# Patient Record
Sex: Female | Born: 1975 | Race: Black or African American | Hispanic: No | Marital: Married | Smoking: Never smoker
Health system: Southern US, Community
[De-identification: ages and names within clinical notes are randomized; demographics above are authoritative.]

## PROBLEM LIST (undated history)

## (undated) DIAGNOSIS — E559 Vitamin D deficiency, unspecified: Principal | ICD-10-CM

## (undated) DIAGNOSIS — R87629 Unspecified abnormal cytological findings in specimens from vagina: Secondary | ICD-10-CM

## (undated) DIAGNOSIS — E785 Hyperlipidemia, unspecified: Secondary | ICD-10-CM

## (undated) DIAGNOSIS — A630 Anogenital (venereal) warts: Secondary | ICD-10-CM

## (undated) HISTORY — DX: Anogenital (venereal) warts: A63.0

## (undated) HISTORY — DX: Hyperlipidemia, unspecified: E78.5

## (undated) HISTORY — DX: Vitamin D deficiency, unspecified: E55.9

## (undated) HISTORY — DX: Unspecified abnormal cytological findings in specimens from vagina: R87.629

---

## 2007-02-27 HISTORY — PX: DILATION AND CURETTAGE OF UTERUS: SHX78

## 2012-02-27 HISTORY — PX: ESSURE TUBAL LIGATION: SUR464

## 2014-11-09 ENCOUNTER — Other Ambulatory Visit (HOSPITAL_COMMUNITY)
Admission: RE | Admit: 2014-11-09 | Discharge: 2014-11-09 | Disposition: A | Discharge status: Home / Self Care | Payer: BLUE CROSS/BLUE SHIELD | Source: Ambulatory Visit | Attending: Adult Health | Admitting: Adult Health

## 2014-11-09 ENCOUNTER — Encounter: Payer: Self-pay | Admitting: Adult Health

## 2014-11-09 ENCOUNTER — Ambulatory Visit (INDEPENDENT_AMBULATORY_CARE_PROVIDER_SITE_OTHER): Payer: BLUE CROSS/BLUE SHIELD | Admitting: Adult Health

## 2014-11-09 VITALS — BP 130/80 | HR 88 | Ht 65.0 in | Wt 231.0 lb

## 2014-11-09 DIAGNOSIS — Z113 Encounter for screening for infections with a predominantly sexual mode of transmission: Secondary | ICD-10-CM | POA: Diagnosis present

## 2014-11-09 DIAGNOSIS — Z1151 Encounter for screening for human papillomavirus (HPV): Secondary | ICD-10-CM | POA: Insufficient documentation

## 2014-11-09 DIAGNOSIS — Z01419 Encounter for gynecological examination (general) (routine) without abnormal findings: Secondary | ICD-10-CM | POA: Diagnosis not present

## 2014-11-09 DIAGNOSIS — Z01411 Encounter for gynecological examination (general) (routine) with abnormal findings: Secondary | ICD-10-CM | POA: Diagnosis present

## 2014-11-09 DIAGNOSIS — A630 Anogenital (venereal) warts: Secondary | ICD-10-CM

## 2014-11-09 MED ORDER — IMIQUIMOD 5 % EX CREA: TOPICAL_CREAM | CUTANEOUS | Status: DC

## 2014-11-09 NOTE — Patient Instructions (Signed)
Physical in 1 year Mammogram at 41  Fasting labs in future

## 2014-11-09 NOTE — Progress Notes (Signed)
Patient ID: Gina Mccullough, female   DOB: [birth year], 39 y.o.   MRN: [id number] History of Present Illness: Gina Mccullough is a 39 year old black female,married in for well woman gyn exam and pap, and complains of warts,wants Rx for aldara.She wants STD testing.She is new to this practice, is from [place].   Current Medications, Allergies, Past Medical History, Past Surgical History, Family History and Social History were reviewed in [employer] record.     Review of Systems: Patient denies any headaches, hearing loss, fatigue, blurred vision, shortness of breath, chest pain, abdominal pain, problems with bowel movements, urination, or intercourse. No joint pain or mood swings.See HPI for positives.    Physical Exam:BP 130/80 mmHg  Pulse 88  Ht  (1.651 m)  Wt 231 lb (104.781 kg)  BMI 38.44 kg/m2  LMP 11/05/2014 General:  Well developed, well nourished, no acute distress Skin:  Warm and dry Neck:  Midline trachea, normal thyroid, good ROM, no lymphadenopathy Lungs; Clear to auscultation bilaterally Breast:  No dominant palpable mass, retraction, or nipple discharge, has tenderness RUQ of right breast Cardiovascular: Regular rate and rhythm Abdomen:  Soft, non tender, no hepatosplenomegaly Pelvic:  External genitalia is normal in appearance, no lesions.  The vagina is normal in appearance, with period like blood. Urethra has no lesions or masses. The cervix is bulbous,pap with HPV and GC/CHL performed, has dark period blood from os.  Uterus is felt to be normal size, shape, and contour.  No adnexal masses or tenderness noted.Bladder is non tender, no masses felt.She says has warts near anal area. Extremities/musculoskeletal:  No swelling or varicosities noted, no clubbing or cyanosis Psych:  No mood changes, alert and cooperative,seems happy   Impression: Well woman gyn exam with Pap STD screening Warts     Plan: HIV,RPR and HSV2 Physical in 1 year Mammogram at  41 Fasting labs in near future Rx aldara 5% cream, #12 with 1 refill use 3 x a week to warts

## 2014-11-10 LAB — HSV 2 ANTIBODY, IGG

## 2014-11-10 LAB — HIV ANTIBODY (ROUTINE TESTING W REFLEX): HIV SCREEN 4TH GENERATION: NONREACTIVE

## 2014-11-10 LAB — RPR: RPR: NONREACTIVE

## 2014-11-11 LAB — CYTOLOGY - PAP

## 2015-11-15 ENCOUNTER — Other Ambulatory Visit (HOSPITAL_COMMUNITY)
Admission: RE | Admit: 2015-11-15 | Discharge: 2015-11-15 | Disposition: A | Discharge status: Home / Self Care | Payer: BLUE CROSS/BLUE SHIELD | Source: Ambulatory Visit | Attending: Adult Health | Admitting: Adult Health

## 2015-11-15 ENCOUNTER — Encounter: Payer: Self-pay | Admitting: Adult Health

## 2015-11-15 ENCOUNTER — Ambulatory Visit (INDEPENDENT_AMBULATORY_CARE_PROVIDER_SITE_OTHER): Payer: BLUE CROSS/BLUE SHIELD | Admitting: Adult Health

## 2015-11-15 VITALS — BP 148/92 | HR 92 | Ht 65.0 in | Wt 230.0 lb

## 2015-11-15 DIAGNOSIS — Z01411 Encounter for gynecological examination (general) (routine) with abnormal findings: Secondary | ICD-10-CM | POA: Diagnosis not present

## 2015-11-15 DIAGNOSIS — Z113 Encounter for screening for infections with a predominantly sexual mode of transmission: Secondary | ICD-10-CM | POA: Insufficient documentation

## 2015-11-15 DIAGNOSIS — Z1151 Encounter for screening for human papillomavirus (HPV): Secondary | ICD-10-CM | POA: Insufficient documentation

## 2015-11-15 DIAGNOSIS — Z01419 Encounter for gynecological examination (general) (routine) without abnormal findings: Secondary | ICD-10-CM

## 2015-11-15 DIAGNOSIS — A630 Anogenital (venereal) warts: Secondary | ICD-10-CM | POA: Diagnosis not present

## 2015-11-15 MED ORDER — IMIQUIMOD 5 % EX CREA: TOPICAL_CREAM | CUTANEOUS | 1 refills | Status: DC

## 2015-11-15 NOTE — Progress Notes (Signed)
Patient ID: Gina Mccullough, female   DOB: 12/16/1975, 40 y.o.   MRN: [id number] History of Present Illness: Gina Mccullough is a 40 year old black female, married in for a well woman gyn exam and pap.Her pap last year showed ASCUS with negative HPV.She is requesting STD testing.She says she does not have PCP at present.She drives for [employer].    Current Medications, Allergies, Past Medical History, Past Surgical History, Family History and Social History were reviewed in [facility] electronic [facility] record.     Review of Systems: Patient denies any headaches, hearing loss, fatigue, blurred vision, shortness of breath, chest pain, abdominal pain, problems with bowel movements, urination, or intercourse. No joint pain or mood swings. She thinks she still has 2 small warts and wants Aldara refilled.She said stomach upset today,thinks it is something she ate.   Physical Exam:BP (!) 148/92 (BP Location: Left Arm, Patient Position: Sitting, Cuff Size: Large)   Pulse 92   Ht 5\' 5"  (1.651 m)   Wt 230 lb (104.3 kg)   LMP 10/15/2015   BMI 38.27 kg/m  General:  Well developed, well nourished, no acute distress Skin:  Warm and dry Neck:  Midline trachea, normal thyroid, good ROM, no lymphadenopathy Lungs; Clear to auscultation bilaterally Breast:  No dominant palpable mass, retraction, or nipple discharge Cardiovascular: Regular rate and rhythm Abdomen:  Soft, non tender, no hepatosplenomegaly Pelvic:  External genitalia is normal in appearance, no lesions.  The vagina is normal in appearance. Urethra has no lesions or masses. The cervix is bulbous. Pap with HPV and GC/CHL performed. Uterus is felt to be normal size, shape, and contour.  No adnexal masses or tenderness noted.Bladder is non tender, no masses felt.I did not see any warts today. Extremities/musculoskeletal:  No swelling or varicosities noted, no clubbing or cyanosis Psych:  No mood changes, alert and cooperative,seems happy   Impression: 1.  Encounter for gynecological examination with Papanicolaou smear of cervix   2. Warts, genital   3. Screening for STD (sexually transmitted disease)       Plan: Check CBC,CMP,TSH and lipids,A1c and vitamin D,HIV,RPR and HSV 2 Rx aldara 5% cream #12 use 3 x weekly as needed with 1 refill Physical in 1 year, pap in 3 if normal

## 2015-11-15 NOTE — Patient Instructions (Signed)
Physical in 1 year, pap in 3 if normal  Will talk when labs back 

## 2015-11-16 ENCOUNTER — Telehealth: Payer: Self-pay | Admitting: Adult Health

## 2015-11-16 ENCOUNTER — Encounter: Payer: Self-pay | Admitting: Adult Health

## 2015-11-16 DIAGNOSIS — E559 Vitamin D deficiency, unspecified: Secondary | ICD-10-CM

## 2015-11-16 DIAGNOSIS — E785 Hyperlipidemia, unspecified: Secondary | ICD-10-CM

## 2015-11-16 HISTORY — DX: Vitamin D deficiency, unspecified: E55.9

## 2015-11-16 HISTORY — DX: Hyperlipidemia, unspecified: E78.5

## 2015-11-16 LAB — COMPREHENSIVE METABOLIC PANEL
ALBUMIN: 3.8 g/dL (ref 3.5–5.5)
ALT: 15 IU/L (ref 0–32)
AST: 16 IU/L (ref 0–40)
Albumin/Globulin Ratio: 1.2 (ref 1.2–2.2)
Alkaline Phosphatase: 96 IU/L (ref 39–117)
BILIRUBIN TOTAL: 0.4 mg/dL (ref 0.0–1.2)
BUN / CREAT RATIO: 24 — AB (ref 9–23)
BUN: 17 mg/dL (ref 6–20)
CALCIUM: 8.7 mg/dL (ref 8.7–10.2)
CHLORIDE: 105 mmol/L (ref 96–106)
CO2: 20 mmol/L (ref 18–29)
CREATININE: 0.7 mg/dL (ref 0.57–1.00)
GFR, EST AFRICAN AMERICAN: 127 mL/min/{1.73_m2} (ref >59)
GFR, EST NON AFRICAN AMERICAN: 110 mL/min/{1.73_m2} (ref >59)
GLUCOSE: 109 mg/dL — AB (ref 65–99)
Globulin, Total: 3.1 g/dL (ref 1.5–4.5)
Potassium: 4.1 mmol/L (ref 3.5–5.2)
Sodium: 139 mmol/L (ref 134–144)
TOTAL PROTEIN: 6.9 g/dL (ref 6.0–8.5)

## 2015-11-16 LAB — CBC
HEMATOCRIT: 38.9 % (ref 34.0–46.6)
HEMOGLOBIN: 12.5 g/dL (ref 11.1–15.9)
MCH: 26.7 pg (ref 26.6–33.0)
MCHC: 32.1 g/dL (ref 31.5–35.7)
MCV: 83 fL (ref 79–97)
Platelets: 330 10*3/uL (ref 150–379)
RBC: 4.69 x10E6/uL (ref 3.77–5.28)
RDW: 14.2 % (ref 12.3–15.4)
WBC: 7.6 10*3/uL (ref 3.4–10.8)

## 2015-11-16 LAB — LIPID PANEL
Chol/HDL Ratio: 5.6 ratio units — ABNORMAL HIGH (ref 0.0–4.4)
Cholesterol, Total: 174 mg/dL (ref 100–199)
HDL: 31 mg/dL — ABNORMAL LOW (ref >39)
LDL CALC: 95 mg/dL (ref 0–99)
Triglycerides: 241 mg/dL — ABNORMAL HIGH (ref 0–149)
VLDL CHOLESTEROL CAL: 48 mg/dL — AB (ref 5–40)

## 2015-11-16 LAB — HEMOGLOBIN A1C
ESTIMATED AVERAGE GLUCOSE: 114 mg/dL
HEMOGLOBIN A1C: 5.6 % (ref 4.8–5.6)

## 2015-11-16 LAB — HSV 2 ANTIBODY, IGG: HSV 2 Glycoprotein G Ab, IgG: <0.91 index (ref 0.00–0.90)

## 2015-11-16 LAB — HIV ANTIBODY (ROUTINE TESTING W REFLEX): HIV Screen 4th Generation wRfx: NONREACTIVE

## 2015-11-16 LAB — RPR: RPR Ser Ql: NONREACTIVE

## 2015-11-16 LAB — VITAMIN D 25 HYDROXY (VIT D DEFICIENCY, FRACTURES): Vit D, 25-Hydroxy: 16 ng/mL — ABNORMAL LOW (ref 30.0–100.0)

## 2015-11-16 LAB — TSH: TSH: 3.17 u[IU]/mL (ref 0.450–4.500)

## 2015-11-16 MED ORDER — CHOLECALCIFEROL 125 MCG (5000 UT) PO CAPS: 5000.0000 [IU] | ORAL_CAPSULE | Freq: Every day | ORAL | Status: DC

## 2015-11-16 NOTE — Telephone Encounter (Signed)
Pt aware of labs and need to decrease carbs, watch fats and increase activity and take vitamin D3 5000 IU daily and recheck labs(lipids,CMP and vitamin D) in 6 months, placed in recall for labs in 6 months

## 2015-11-16 NOTE — Telephone Encounter (Signed)
Left message to call about labs 

## 2015-11-17 LAB — CYTOLOGY - PAP

## 2015-11-18 ENCOUNTER — Telehealth: Payer: Self-pay | Admitting: Adult Health

## 2015-11-18 NOTE — Telephone Encounter (Signed)
Left message that pap showed some atypical cells but was negative for HPV and GC/CHL will repeat pap in 1 year, call with any questions.

## 2016-09-16 ENCOUNTER — Inpatient Hospital Stay (HOSPITAL_COMMUNITY): Payer: BLUE CROSS/BLUE SHIELD

## 2016-09-16 ENCOUNTER — Encounter (HOSPITAL_COMMUNITY): Payer: Self-pay | Admitting: *Deleted

## 2016-09-16 ENCOUNTER — Inpatient Hospital Stay (HOSPITAL_COMMUNITY)
Admission: EM | Admit: 2016-09-16 | Discharge: 2016-09-19 | DRG: 440 | Disposition: A | Discharge status: Home / Self Care | Payer: BLUE CROSS/BLUE SHIELD | Attending: Internal Medicine | Admitting: Internal Medicine

## 2016-09-16 ENCOUNTER — Emergency Department (HOSPITAL_COMMUNITY): Payer: BLUE CROSS/BLUE SHIELD

## 2016-09-16 DIAGNOSIS — R739 Hyperglycemia, unspecified: Secondary | ICD-10-CM | POA: Diagnosis present

## 2016-09-16 DIAGNOSIS — E785 Hyperlipidemia, unspecified: Secondary | ICD-10-CM | POA: Diagnosis present

## 2016-09-16 DIAGNOSIS — Z833 Family history of diabetes mellitus: Secondary | ICD-10-CM

## 2016-09-16 DIAGNOSIS — Z8249 Family history of ischemic heart disease and other diseases of the circulatory system: Secondary | ICD-10-CM

## 2016-09-16 DIAGNOSIS — R1013 Epigastric pain: Secondary | ICD-10-CM | POA: Diagnosis present

## 2016-09-16 DIAGNOSIS — K859 Acute pancreatitis without necrosis or infection, unspecified: Principal | ICD-10-CM | POA: Diagnosis present

## 2016-09-16 DIAGNOSIS — Z79899 Other long term (current) drug therapy: Secondary | ICD-10-CM | POA: Diagnosis not present

## 2016-09-16 DIAGNOSIS — A630 Anogenital (venereal) warts: Secondary | ICD-10-CM | POA: Diagnosis present

## 2016-09-16 DIAGNOSIS — R7302 Impaired glucose tolerance (oral): Secondary | ICD-10-CM

## 2016-09-16 DIAGNOSIS — M4696 Unspecified inflammatory spondylopathy, lumbar region: Secondary | ICD-10-CM | POA: Diagnosis present

## 2016-09-16 DIAGNOSIS — E559 Vitamin D deficiency, unspecified: Secondary | ICD-10-CM | POA: Diagnosis present

## 2016-09-16 DIAGNOSIS — M5416 Radiculopathy, lumbar region: Secondary | ICD-10-CM

## 2016-09-16 LAB — COMPREHENSIVE METABOLIC PANEL
ALK PHOS: 86 U/L (ref 38–126)
ALT: 21 U/L (ref 14–54)
ANION GAP: 6 (ref 5–15)
AST: 22 U/L (ref 15–41)
Albumin: 3.8 g/dL (ref 3.5–5.0)
BUN: 16 mg/dL (ref 6–20)
CALCIUM: 9.5 mg/dL (ref 8.9–10.3)
CO2: 24 mmol/L (ref 22–32)
CREATININE: 0.63 mg/dL (ref 0.44–1.00)
Chloride: 108 mmol/L (ref 101–111)
Glucose, Bld: 116 mg/dL — ABNORMAL HIGH (ref 65–99)
Potassium: 3.8 mmol/L (ref 3.5–5.1)
SODIUM: 138 mmol/L (ref 135–145)
Total Bilirubin: 1 mg/dL (ref 0.3–1.2)
Total Protein: 7.7 g/dL (ref 6.5–8.1)

## 2016-09-16 LAB — URINALYSIS, ROUTINE W REFLEX MICROSCOPIC
BILIRUBIN URINE: NEGATIVE
Bacteria, UA: NONE SEEN
GLUCOSE, UA: NEGATIVE mg/dL
KETONES UR: NEGATIVE mg/dL
NITRITE: NEGATIVE
PH: 6 (ref 5.0–8.0)
PROTEIN: 30 mg/dL — AB
Specific Gravity, Urine: 1.028 (ref 1.005–1.030)

## 2016-09-16 LAB — LIPASE, BLOOD: LIPASE: 2003 U/L — AB (ref 11–51)

## 2016-09-16 LAB — LIPID PANEL
CHOL/HDL RATIO: 4.3 ratio
CHOLESTEROL: 147 mg/dL (ref 0–200)
HDL: 34 mg/dL — ABNORMAL LOW (ref >40)
LDL CALC: 100 mg/dL — AB (ref 0–99)
Triglycerides: 63 mg/dL (ref <150)
VLDL: 13 mg/dL (ref 0–40)

## 2016-09-16 LAB — CBC
HCT: 36.5 % (ref 36.0–46.0)
HEMOGLOBIN: 12.1 g/dL (ref 12.0–15.0)
MCH: 27.4 pg (ref 26.0–34.0)
MCHC: 33.2 g/dL (ref 30.0–36.0)
MCV: 82.8 fL (ref 78.0–100.0)
PLATELETS: 306 10*3/uL (ref 150–400)
RBC: 4.41 MIL/uL (ref 3.87–5.11)
RDW: 13.3 % (ref 11.5–15.5)
WBC: 8.4 10*3/uL (ref 4.0–10.5)

## 2016-09-16 LAB — I-STAT BETA HCG BLOOD, ED (MC, WL, AP ONLY): I-stat hCG, quantitative: <5 m[IU]/mL (ref <5)

## 2016-09-16 MED ORDER — ACETAMINOPHEN 650 MG RE SUPP: 650.0000 mg | Freq: Four times a day (QID) | RECTAL | Status: DC | PRN

## 2016-09-16 MED ORDER — ONDANSETRON HCL 4 MG PO TABS: 4.0000 mg | ORAL_TABLET | Freq: Four times a day (QID) | ORAL | Status: DC | PRN

## 2016-09-16 MED ORDER — ORAL CARE MOUTH RINSE
15.0000 mL | Freq: Two times a day (BID) | OROMUCOSAL | Status: DC
  Administered 2016-09-16 – 2016-09-19 (×7): 15 mL via OROMUCOSAL

## 2016-09-16 MED ORDER — DICYCLOMINE HCL 10 MG/ML IM SOLN
20.0000 mg | Freq: Once | INTRAMUSCULAR | Status: AC
  Administered 2016-09-16: 20 mg via INTRAMUSCULAR
  Filled 2016-09-16: qty 2

## 2016-09-16 MED ORDER — MORPHINE SULFATE (PF) 2 MG/ML IV SOLN
INTRAVENOUS | Status: AC
  Administered 2016-09-16: 4 mg via INTRAVENOUS
  Filled 2016-09-16: qty 2

## 2016-09-16 MED ORDER — ENOXAPARIN SODIUM 40 MG/0.4ML ~~LOC~~ SOLN
40.0000 mg | SUBCUTANEOUS | Status: DC
  Administered 2016-09-16 – 2016-09-19 (×4): 40 mg via SUBCUTANEOUS
  Filled 2016-09-16 (×4): qty 0.4

## 2016-09-16 MED ORDER — FENTANYL CITRATE (PF) 100 MCG/2ML IJ SOLN
50.0000 ug | Freq: Once | INTRAMUSCULAR | Status: AC
  Administered 2016-09-16: 50 ug via INTRAVENOUS
  Filled 2016-09-16: qty 2

## 2016-09-16 MED ORDER — ACETAMINOPHEN 325 MG PO TABS
650.0000 mg | ORAL_TABLET | Freq: Four times a day (QID) | ORAL | Status: DC | PRN
  Administered 2016-09-19: 650 mg via ORAL
  Filled 2016-09-16 (×2): qty 2

## 2016-09-16 MED ORDER — ONDANSETRON HCL 4 MG/2ML IJ SOLN
4.0000 mg | Freq: Four times a day (QID) | INTRAMUSCULAR | Status: DC | PRN
  Administered 2016-09-16 – 2016-09-17 (×4): 4 mg via INTRAVENOUS
  Filled 2016-09-16 (×4): qty 2

## 2016-09-16 MED ORDER — SODIUM CHLORIDE 0.9 % IV BOLUS (SEPSIS)
1000.0000 mL | Freq: Once | INTRAVENOUS | Status: AC
  Administered 2016-09-16: 1000 mL via INTRAVENOUS

## 2016-09-16 MED ORDER — SODIUM CHLORIDE 0.9 % IV BOLUS (SEPSIS)
500.0000 mL | Freq: Once | INTRAVENOUS | Status: AC
  Administered 2016-09-16: 500 mL via INTRAVENOUS

## 2016-09-16 MED ORDER — POTASSIUM CHLORIDE IN NACL 20-0.9 MEQ/L-% IV SOLN
INTRAVENOUS | Status: DC
  Administered 2016-09-16 – 2016-09-19 (×9): via INTRAVENOUS
  Filled 2016-09-16 (×3): qty 1000

## 2016-09-16 MED ORDER — MORPHINE SULFATE (PF) 4 MG/ML IV SOLN
4.0000 mg | INTRAVENOUS | Status: DC | PRN
  Administered 2016-09-16 – 2016-09-18 (×6): 4 mg via INTRAVENOUS
  Filled 2016-09-16 (×6): qty 1

## 2016-09-16 MED ORDER — IOPAMIDOL (ISOVUE-300) INJECTION 61%
100.0000 mL | Freq: Once | INTRAVENOUS | Status: AC | PRN
  Administered 2016-09-16: 100 mL via INTRAVENOUS

## 2016-09-16 MED ORDER — MORPHINE SULFATE (PF) 4 MG/ML IV SOLN
4.0000 mg | INTRAVENOUS | Status: DC | PRN
  Administered 2016-09-16: 4 mg via INTRAVENOUS
  Filled 2016-09-16: qty 1

## 2016-09-16 NOTE — ED Triage Notes (Signed)
Pt c/o abd pain that radiates around upper abd area to back,

## 2016-09-16 NOTE — H&P (Signed)
History and Physical  Gina Bruckneradia Kimberlin RUE:[id number] DOB: 1975/06/30 DOA: 09/16/2016   PCP: Patient, No Pcp Per   Patient coming from: Home  Chief Complaint: Home  HPI:  Gina Mccullough is a 41 y.o. female with dyslipidemia presents with one-day history of epigastric abdominal pain that began on the evening of 09/15/2016.  The patient had some nausea without emesis or diarrhea. She has not had any fevers, chills, dysuria, hematuria, hematochezia, melena. She denies any exacerbating or alleviating factors. She denies any recent travels or eating any unusual foods. She has not started any new medications, nor has she started taking any new supplements over-the-counter. She denies any alcohol or illegal drug use. In the emergency department, the patient was afebrile hemodynamically stable saturating 99% on room air. BMP and CBC were unremarkable. Hepatic enzymes were unremarkable. Lipase was 2003. Urinalysis was negative for pyuria. The patient was in May for acute pancreatitis.  Assessment/Plan: Acute pancreatitis -Etiology unclear -remain npo -IVF -RUQ ultrasound -CT abd/pelvis -lipid panel -HIV -ANA -pain control  Hyperglycemia -Hemoglobin A1c       Past Medical History:  Diagnosis Date  . Dyslipidemia 11/16/2015  . Vaginal Pap smear, abnormal    hx HPV  . Vitamin D deficiency 11/16/2015  . Warts, genital    Past Surgical History:  Procedure Laterality Date  . CESAREAN SECTION  [id number]  . DILATION AND CURETTAGE OF UTERUS  2009  . ESSURE TUBAL LIGATION  2014   Social History:  reports that she has never smoked. She has never used smokeless tobacco. She reports that she does not drink alcohol or use drugs.   Family History  Problem Relation Age of Onset  . Heart disease Mother   . Stroke Mother   . Diabetes Brother      No Known Allergies   Prior to Admission medications   Medication Sig Start Date End Date Taking? Authorizing Provider  Cholecalciferol  5000 units capsule Take 1 capsule (5,000 Units total) by mouth daily. 11/16/15  Yes Adline PotterGriffin, Jennifer A, NP  vitamin B-12 (CYANOCOBALAMIN) 100 MCG tablet Take 100 mcg by mouth daily.   Yes [provider]  imiquimod (ALDARA) 5 % cream Apply topically 3 (three) times a week. 11/16/15   Adline PotterGriffin, Jennifer A, NP    Review of Systems:  Constitutional:  No weight loss, night sweats, Fevers, chills, fatigue.  Head&Eyes: No headache.  No vision loss.  No eye pain or scotoma ENT:  No Difficulty swallowing,Tooth/dental problems,Sore throat,  No ear ache, post nasal drip,  Cardio-vascular:  No chest pain, Orthopnea, PND, swelling in lower extremities,  dizziness, palpitations  GI:  No  vomiting, diarrhea, loss of appetite, hematochezia, melena, heartburn, indigestion, Resp:  No shortness of breath with exertion or at rest. No cough. No coughing up of blood .No wheezing.No chest wall deformity  Skin:  no rash or lesions.  GU:  no dysuria, change in color of urine, no urgency or frequency. No flank pain.  Musculoskeletal:  No joint pain or swelling. No decreased range of motion. No back pain.  Psych:  No change in mood or affect. No depression or anxiety. Neurologic: No headache, no dysesthesia, no focal weakness, no vision loss. No syncope  Physical Exam: Vitals:   09/16/16 0543 09/16/16 0728 09/16/16 0730 09/16/16 0745  BP: (!) 161/90 (!) 141/82 138/88 138/88  Pulse: 83 81 73 80  Resp: 16   15  Temp: 98.3 F (36.8 C)     TempSrc:  Oral     SpO2: 95% 99% 97% 98%  Weight:      Height:       General:  A&O x 3, NAD, nontoxic, pleasant/cooperative Head/Eye: No conjunctival hemorrhage, no icterus, [place]/AT, No nystagmus ENT:  No icterus,  No thrush, good dentition, no pharyngeal exudate Neck:  No masses, no lymphadenpathy, no bruits CV:  RRR, no rub, no gallop, no S3 Lung:  CTAB, good air movement, no wheeze, no rhonchi Abdomen: soft/NT, +BS, nondistended, no peritoneal signs Ext:  No cyanosis, No rashes, No petechiae, No lymphangitis, No edema Neuro: CNII-XII intact, strength 4/5 in bilateral upper and lower extremities, no dysmetria  Labs on Admission:  Basic Metabolic Panel:  Recent Labs Lab 09/16/16 0554  NA 138  K 3.8  CL 108  CO2 24  GLUCOSE 116*  BUN 16  CREATININE 0.63  CALCIUM 9.5   Liver Function Tests:  Recent Labs Lab 09/16/16 0554  AST 22  ALT 21  ALKPHOS 86  BILITOT 1.0  PROT 7.7  ALBUMIN 3.8    Recent Labs Lab 09/16/16 0554  LIPASE 2,003*   No results for input(s): AMMONIA in the last 168 hours. CBC:  Recent Labs Lab 09/16/16 0554  WBC 8.4  HGB 12.1  HCT 36.5  MCV 82.8  PLT 306   Coagulation Profile: No results for input(s): INR, PROTIME in the last 168 hours. Cardiac Enzymes: No results for input(s): CKTOTAL, CKMB, CKMBINDEX, TROPONINI in the last 168 hours. BNP: Invalid input(s): POCBNP CBG: No results for input(s): GLUCAP in the last 168 hours. Urine analysis:    Component Value Date/Time   COLORURINE YELLOW 09/16/2016 0554   APPEARANCEUR HAZY (A) 09/16/2016 0554   LABSPEC 1.028 09/16/2016 0554   PHURINE 6.0 09/16/2016 0554   GLUCOSEU NEGATIVE 09/16/2016 0554   HGBUR LARGE (A) 09/16/2016 0554   BILIRUBINUR NEGATIVE 09/16/2016 0554   KETONESUR NEGATIVE 09/16/2016 0554   PROTEINUR 30 (A) 09/16/2016 0554   NITRITE NEGATIVE 09/16/2016 0554   LEUKOCYTESUR TRACE (A) 09/16/2016 0554   Sepsis Labs: @LABRCNTIP (procalcitonin:4,lacticidven:4) )No results found for this or any previous visit (from the past 240 hour(s)).   Radiological Exams on Admission: No results found.      Time spent:60 minutes Code Status:   FULL Family Communication:  Son updated at bedside Disposition Plan: expect 2-3 day hospitalization Consults called: none DVT Prophylaxis: [place] Lovenox  Golden Gilreath, DO  [facility] Pager [phone]  If 7PM-7AM, please contact night-coverage [email or web address] Password  [email or web address] 09/16/2016, 8:14 AM

## 2016-09-16 NOTE — ED Notes (Signed)
Pt taken to US

## 2016-09-16 NOTE — ED Notes (Signed)
Dr Tat wanted both US and CT. CT tech aware

## 2016-09-16 NOTE — ED Notes (Signed)
Pt requesting pain med. Pt up to bathroom without difficulty

## 2016-09-16 NOTE — ED Provider Notes (Signed)
[facility]-EMERGENCY DEPT Provider Note   CSN: [id number] Arrival date & time: 09/16/16  0533  Time seen 06:05 AM   History   Chief Complaint Chief Complaint  Patient presents with  . Abdominal Pain    HPI Gina Mccullough is a 41 y.o. female.  HPI  patient reports she started getting epigastric abdominal pain about 7:30 PM last night. She denies anything earlier in the day however she did eat lasagna about 10 PM. She states the pain is sharp and constant but waxes and wanes. She states it radiates underneath her rib cage into her upper back. She states certain movements make the pain worse, nothing makes it feel better. She denies any change in her activity or eating anything unusual. She denies nausea, vomiting, or diarrhea. She denies burning in her throat or reflux symptoms. She states she's never had this pain before. She denies abdominal bloating. She took 2 Tylenol about 45 minutes prior to arrival without relief. She states she's had C-section 2 otherwise no prior surgery. Family history is negative for gallstones.  PCP none  Past Medical History:  Diagnosis Date  . Dyslipidemia 11/16/2015  . Vaginal Pap smear, abnormal    hx HPV  . Vitamin D deficiency 11/16/2015  . Warts, genital     Patient Active Problem List   Diagnosis Date Noted  . Vitamin D deficiency 11/16/2015  . Dyslipidemia 11/16/2015  . Warts, genital 11/09/2014    Past Surgical History:  Procedure Laterality Date  . CESAREAN SECTION  [id number]  . DILATION AND CURETTAGE OF UTERUS  2009  . ESSURE TUBAL LIGATION  2014    OB History    Gravida Para Term Preterm AB Living   6 2     4 2    SAB TAB Ectopic Multiple Live Births   2 2             Home Medications    Prior to Admission medications   Medication Sig Start Date End Date Taking? Authorizing Provider  Cholecalciferol 5000 units capsule Take 1 capsule (5,000 Units total) by mouth daily. 11/16/15  Yes Adline Potter, NP  vitamin B-12  (CYANOCOBALAMIN) 100 MCG tablet Take 100 mcg by mouth daily.   Yes [provider]  imiquimod (ALDARA) 5 % cream Apply topically 3 (three) times a week. 11/16/15   Adline Potter, NP    Family History Family History  Problem Relation Age of Onset  . Heart disease Mother   . Stroke Mother   . Diabetes Brother     Social History Social History  Substance Use Topics  . Smoking status: Never Smoker  . Smokeless tobacco: Never Used  . Alcohol use No  employed   Allergies   Patient has no known allergies.   Review of Systems Review of Systems  All other systems reviewed and are negative.    Physical Exam Updated Vital Signs BP (!) 161/90 (BP Location: Left Arm)   Pulse 83   Temp 98.3 F (36.8 C) (Oral)   Resp 16   Ht 5\' 3"  (1.6 m)   Wt 99.8 kg (220 lb)   LMP 09/12/2016   SpO2 95%   BMI 38.97 kg/m   Vital signs normal except for hypertension   Physical Exam  Constitutional: She is oriented to person, place, and time. She appears well-developed and well-nourished.  Non-toxic appearance. She does not appear ill. No distress.  HENT:  Head: Normocephalic and atraumatic.  Right Ear: External ear normal.  Left Ear: External ear normal.  Nose: Nose normal. No mucosal edema or rhinorrhea.  Mouth/Throat: Oropharynx is clear and moist and mucous membranes are normal. No dental abscesses or uvula swelling.  Eyes: Pupils are equal, round, and reactive to light. Conjunctivae and EOM are normal.  Neck: Normal range of motion and full passive range of motion without pain. Neck supple.  Cardiovascular: Normal rate, regular rhythm and normal heart sounds.  Exam reveals no gallop and no friction rub.   No murmur heard. Pulmonary/Chest: Effort normal and breath sounds normal. No respiratory distress. She has no wheezes. She has no rhonchi. She has no rales. She exhibits no tenderness and no crepitus.  Abdominal: Soft. Normal appearance and bowel sounds are normal. She  exhibits no distension. There is tenderness in the epigastric area. There is no rebound and no guarding.    Genitourinary:  Genitourinary Comments: No CVAT bilaterally  Musculoskeletal: Normal range of motion. She exhibits no edema or tenderness.  Moves all extremities well.   Neurological: She is alert and oriented to person, place, and time. She has normal strength. No cranial nerve deficit.  Skin: Skin is warm, dry and intact. No rash noted. No erythema. No pallor.  Psychiatric: She has a normal mood and affect. Her speech is normal and behavior is normal. Her mood appears not anxious.  Nursing note and vitals reviewed.    ED Treatments / Results  Labs (all labs ordered are listed, but only abnormal results are displayed) Results for orders placed or performed during the hospital encounter of 09/16/16  CBC  Result Value Ref Range   WBC 8.4 4.0 - 10.5 K/uL   RBC 4.41 3.87 - 5.11 MIL/uL   Hemoglobin 12.1 12.0 - 15.0 g/dL   HCT 40.9 81.1 - 91.4 %   MCV 82.8 78.0 - 100.0 fL   MCH 27.4 26.0 - 34.0 pg   MCHC 33.2 30.0 - 36.0 g/dL   RDW 78.2 95.6 - 21.3 %   Platelets 306 150 - 400 K/uL  Urinalysis, Routine w reflex microscopic  Result Value Ref Range   Color, Urine YELLOW YELLOW   APPearance HAZY (A) CLEAR   Specific Gravity, Urine 1.028 1.005 - 1.030   pH 6.0 5.0 - 8.0   Glucose, UA NEGATIVE NEGATIVE mg/dL   Hgb urine dipstick LARGE (A) NEGATIVE   Bilirubin Urine NEGATIVE NEGATIVE   Ketones, ur NEGATIVE NEGATIVE mg/dL   Protein, ur 30 (A) NEGATIVE mg/dL   Nitrite NEGATIVE NEGATIVE   Leukocytes, UA TRACE (A) NEGATIVE   RBC / HPF TOO NUMEROUS TO COUNT 0 - 5 RBC/hpf   WBC, UA 0-5 0 - 5 WBC/hpf   Bacteria, UA NONE SEEN NONE SEEN   Squamous Epithelial / LPF 0-5 (A) NONE SEEN   Mucous PRESENT   I-Stat beta hCG blood, ED  Result Value Ref Range   I-stat hCG, quantitative <5.0 <5 mIU/mL   Comment 3           Laboratory interpretation all normal except  Hematuria (pt is on  menses)    EKG  EKG Interpretation None       Radiology No results found.  Procedures Procedures (including critical care time)  Medications Ordered in ED Medications  sodium chloride 0.9 % bolus 1,000 mL (1,000 mLs Intravenous New Bag/Given 09/16/16 0649)  sodium chloride 0.9 % bolus 500 mL (500 mLs Intravenous New Bag/Given 09/16/16 0649)  dicyclomine (BENTYL) injection 20 mg (20 mg Intramuscular Given 09/16/16 0650)  Initial Impression / Assessment and Plan / ED Course  I have reviewed the triage vital signs and the nursing notes.  Pertinent labs & imaging results that were available during my care of the patient were reviewed by me and considered in my medical decision making (see chart for details).  Patient was given IV fluids and IM Bentyl for her pain. Her pain does not appear to be GERD, she also does not have pain in the right upper quadrant to suggest gallstones. She may have pancreatitis. Laboratory testing was ordered.  Recheck at 07:30 AM pt states her pain is better, not gone, discussed her lab tests, still waiting for her lipase to return.   07:50 AM discussed her very elevated lipase with patient. She denies alcohol use, she denies having any pain in her right upper quadrant before this. We discussed the most likely cause would be alcohol or gallstones. CT scan the abdomen and right upper quadrant ultrasound were ordered to look for the etiology for pancreatitis. She states her pain was starting to get worse, she was given IV pain medication. We discussed that she would need to be admitted to have bowel rest until her pancreas is much less inflamed and she will be able to eat again. Patient is agreeable.  08:00 AM Dr Tat, hospitalist, will admit.   Final Clinical Impressions(s) / ED Diagnoses   Final diagnoses:  Epigastric abdominal pain    Plan admission  Devoria AlbeIva Tarry Fountain, MD, [facility]    Rance Smithson, MD 09/16/16 [phone]

## 2016-09-17 ENCOUNTER — Inpatient Hospital Stay (HOSPITAL_COMMUNITY): Payer: BLUE CROSS/BLUE SHIELD

## 2016-09-17 LAB — COMPREHENSIVE METABOLIC PANEL
ALK PHOS: 79 U/L (ref 38–126)
ALT: 17 U/L (ref 14–54)
AST: 21 U/L (ref 15–41)
Albumin: 3.3 g/dL — ABNORMAL LOW (ref 3.5–5.0)
Anion gap: 7 (ref 5–15)
BILIRUBIN TOTAL: 0.8 mg/dL (ref 0.3–1.2)
BUN: 8 mg/dL (ref 6–20)
CALCIUM: 8.6 mg/dL — AB (ref 8.9–10.3)
CO2: 23 mmol/L (ref 22–32)
CREATININE: 0.64 mg/dL (ref 0.44–1.00)
Chloride: 105 mmol/L (ref 101–111)
GFR calc Af Amer: >60 mL/min (ref >60)
Glucose, Bld: 142 mg/dL — ABNORMAL HIGH (ref 65–99)
POTASSIUM: 3.6 mmol/L (ref 3.5–5.1)
Sodium: 135 mmol/L (ref 135–145)
TOTAL PROTEIN: 7.1 g/dL (ref 6.5–8.1)

## 2016-09-17 LAB — HIV ANTIBODY (ROUTINE TESTING W REFLEX): HIV SCREEN 4TH GENERATION: NONREACTIVE

## 2016-09-17 LAB — CBC
HEMATOCRIT: 35.9 % — AB (ref 36.0–46.0)
Hemoglobin: 11.9 g/dL — ABNORMAL LOW (ref 12.0–15.0)
MCH: 27.5 pg (ref 26.0–34.0)
MCHC: 33.1 g/dL (ref 30.0–36.0)
MCV: 83.1 fL (ref 78.0–100.0)
PLATELETS: 293 10*3/uL (ref 150–400)
RBC: 4.32 MIL/uL (ref 3.87–5.11)
RDW: 13.3 % (ref 11.5–15.5)
WBC: 6.2 10*3/uL (ref 4.0–10.5)

## 2016-09-17 LAB — HEMOGLOBIN A1C
HEMOGLOBIN A1C: 5.7 % — AB (ref 4.8–5.6)
Mean Plasma Glucose: 117 mg/dL

## 2016-09-17 LAB — LIPASE, BLOOD: LIPASE: 69 U/L — AB (ref 11–51)

## 2016-09-17 NOTE — Progress Notes (Signed)
PROGRESS NOTE  Gina Mccullough ZOX:[id number] DOB: 15-Nov-1975 DOA: 09/16/2016 PCP: Patient, No Pcp Per  Brief History:  41 y.o. female with dyslipidemia presents with one-day history of epigastric abdominal pain that began on the evening of 09/15/2016.  The patient had some nausea without emesis or diarrhea. She has not had any fevers, chills, dysuria, hematuria, hematochezia, melena. She denies any exacerbating or alleviating factors. She denies any recent travels or eating any unusual foods. She has not started any new medications, nor has she started taking any new supplements over-the-counter. She denies any alcohol or illegal drug use. In the emergency department, the patient was afebrile and hemodynamically stable. Lipase was 2003 with normal hepatic enzymes. The patient was admitted for treatment of pancreatitis.  Assessment/Plan: Acute pancreatitis -Etiology unclear -start on clear liquids -continue IVF -RUQ ultrasound--neg cholelithiasis or biliary ductal dilatation  -CT abd/pelvis--minimal peripancreatic stranding without any other acute findings  -lipid panel--LDL 100, triglycerides 63  -HIV--pending  -ANA--pending  -pain control--increased morphine to 4 mg IV every 2 hours  Prn pain  Hyperglycemia -Hemoglobin A1c--pendind  Back pain -MRI Lumbar spine--mild facet arthropathy   Disposition Plan:   Home in 1-2 days  Family Communication:   Family at bedside  Consultants:  none  Code Status:  FULL   DVT Prophylaxis:  [place] Lovenox   Procedures: As Listed in Progress Note Above  Antibiotics: None    Subjective:  she states that her epigastric pain is 25-50% better. She denies any vomiting but has some nausea. She denies any fevers, chills, chest pain, shortness breath, coughing, hemoptysis, vomiting, diarrhea, dysuria, hematuria. She complains of low back pain without any radicular type symptoms. She states the low back pain is intermittent and sharp in  nature without radiation. There is some mild worsening with movement.   Objective: Vitals:   09/16/16 1459 09/16/16 1827 09/16/16 2201 09/17/16 0545  BP: (!) 143/86  127/79 (!) 145/83  Pulse: 79  (!) 59 64  Resp: 20  20 18   Temp: 98 F (36.7 C)  97.6 F (36.4 C) 97.9 F (36.6 C)  TempSrc: Oral  Oral Oral  SpO2: 97% 95% 98% 98%  Weight: 102.3 kg (225 lb 8 oz)     Height: 5\' 3"  (1.6 m)       Intake/Output Summary (Last 24 hours) at 09/17/16 8119 Last data filed at 09/16/16 2142  Gross per 24 hour  Intake           735.42 ml  Output              600 ml  Net           135.42 ml   Weight change: 2.495 kg (5 lb 8 oz) Exam:   General:  Pt is alert, follows commands appropriately, not in acute distress  HEENT: No icterus, No thrush, No neck mass, [place]/AT  Cardiovascular: RRR, S1/S2, no rubs, no gallops  Respiratory: CTA bilaterally, no wheezing, no crackles, no rhonchi  Abdomen: Soft/+BS, epigastric tender, non distended, no guarding  Extremities: No edema, No lymphangitis, No petechiae, No rashes, no synovitis   Data Reviewed: I have personally reviewed following labs and imaging studies Basic Metabolic Panel:  Recent Labs Lab 09/16/16 0554  NA 138  K 3.8  CL 108  CO2 24  GLUCOSE 116*  BUN 16  CREATININE 0.63  CALCIUM 9.5   Liver Function Tests:  Recent Labs Lab 09/16/16 0554  AST 22  ALT 21  ALKPHOS 86  BILITOT 1.0  PROT 7.7  ALBUMIN 3.8    Recent Labs Lab 09/16/16 0554  LIPASE 2,003*   No results for input(s): AMMONIA in the last 168 hours. Coagulation Profile: No results for input(s): INR, PROTIME in the last 168 hours. CBC:  Recent Labs Lab 09/16/16 0554  WBC 8.4  HGB 12.1  HCT 36.5  MCV 82.8  PLT 306   Cardiac Enzymes: No results for input(s): CKTOTAL, CKMB, CKMBINDEX, TROPONINI in the last 168 hours. BNP: Invalid input(s): POCBNP CBG: No results for input(s): GLUCAP in the last 168 hours. HbA1C: No results for input(s):  HGBA1C in the last 72 hours. Urine analysis:    Component Value Date/Time   COLORURINE YELLOW 09/16/2016 0554   APPEARANCEUR HAZY (A) 09/16/2016 0554   LABSPEC 1.028 09/16/2016 0554   PHURINE 6.0 09/16/2016 0554   GLUCOSEU NEGATIVE 09/16/2016 0554   HGBUR LARGE (A) 09/16/2016 0554   BILIRUBINUR NEGATIVE 09/16/2016 0554   KETONESUR NEGATIVE 09/16/2016 0554   PROTEINUR 30 (A) 09/16/2016 0554   NITRITE NEGATIVE 09/16/2016 0554   LEUKOCYTESUR TRACE (A) 09/16/2016 0554   Sepsis Labs: @LABRCNTIP (procalcitonin:4,lacticidven:4) )No results found for this or any previous visit (from the past 240 hour(s)).   Scheduled Meds: . enoxaparin (LOVENOX) injection  40 mg Subcutaneous Q24H  . mouth rinse  15 mL Mouth Rinse BID   Continuous Infusions: . 0.9 % NaCl with KCl 20 mEq / L 125 mL/hr at 09/17/16 0214    Procedures/Studies: Mr Lumbar Spine Wo Contrast  Result Date: 09/17/2016 CLINICAL DATA:  Abdominal pain radiating to the upper back. No known injury. EXAM: MRI LUMBAR SPINE WITHOUT CONTRAST TECHNIQUE: Multiplanar, multisequence MR imaging of the lumbar spine was performed. No intravenous contrast was administered. COMPARISON:  None. FINDINGS: Segmentation:  Standard. Alignment:  Physiologic. Vertebrae:  No fracture, evidence of discitis, or bone lesion. Conus medullaris: Extends to the T12 level and appears normal. Paraspinal and other soft tissues: Negative. Disc levels: Disc spaces: Disc spaces are preserved. T12-L1: No significant disc bulge. No evidence of neural foraminal stenosis. No central canal stenosis. L1-L2: No significant disc bulge. No evidence of neural foraminal stenosis. No central canal stenosis. L2-L3: No significant disc bulge. No evidence of neural foraminal stenosis. No central canal stenosis. L3-L4: No significant disc bulge. Mild bilateral facet arthropathy. No evidence of neural foraminal stenosis. No central canal stenosis. L4-L5: No significant disc bulge. No evidence  of neural foraminal stenosis. No central canal stenosis. Mild bilateral facet arthropathy. L5-S1: No significant disc bulge. No evidence of neural foraminal stenosis. No central canal stenosis. IMPRESSION: 1. Mild bilateral facet arthropathy L3-4 and L4-5. 2. No significant disc protrusion, foraminal stenosis or central canal stenosis. Electronically Signed   By: Elige Ko   On: 09/17/2016 08:34   Ct Abdomen Pelvis W Contrast  Result Date: 09/16/2016 CLINICAL DATA:  Right abdominal pain, acute pancreatitis EXAM: CT ABDOMEN AND PELVIS WITH CONTRAST TECHNIQUE: Multidetector CT imaging of the abdomen and pelvis was performed using the standard protocol following bolus administration of intravenous contrast. CONTRAST:  ISOVUE-300 IOPAMIDOL (ISOVUE-300) INJECTION 61% COMPARISON:  None. FINDINGS: Lower chest: Lung bases are clear. Hepatobiliary: Liver is within normal limits. Gallbladder is unremarkable. No intrahepatic extrahepatic duct dilatation. Pancreas: Pancreas is essentially within normal limits on CT. Very minimal peripancreatic inflammatory changes (for example, series 2/ image 19). No pancreatic mass or ductal dilatation. No peripancreatic fluid collection/pseudocyst. Spleen: Within normal limits. Adrenals/Urinary Tract: Adrenal glands are within normal limits.  Kidneys are within normal limits.  No hydronephrosis. Bladder is underdistended but unremarkable. Stomach/Bowel: Stomach is within normal limits. No evidence of bowel obstruction. Normal appendix (series 2/ image 45). Left colon is decompressed. Vascular/Lymphatic: No evidence of abdominal aortic aneurysm. No suspicious abdominopelvic lymphadenopathy. Reproductive: Uterus is unremarkable.  Bilateral Essure implants. Left ovary is within normal limits.  No right adnexal mass. Other: No abdominopelvic ascites. Mild diastases of the midline anterior abdominal wall. Musculoskeletal: Visualized osseous structures are within normal limits.  IMPRESSION: Very minimal peripancreatic inflammatory changes on CT. No peripancreatic fluid collection/pseudocyst. Electronically Signed   By: Charline BillsSriyesh  Krishnan M.D.   On: 09/16/2016 10:18   [place] Abdomen Limited Ruq  Result Date: 09/16/2016 CLINICAL DATA:  Mid to upper abdominal pain for 1 day. EXAM: ULTRASOUND ABDOMEN LIMITED RIGHT UPPER QUADRANT COMPARISON:  None. FINDINGS: Gallbladder: No gallstones or wall thickening visualized. No sonographic Murphy sign noted by sonographer. Common bile duct: Diameter: 2 mm Liver: No focal lesion identified. Within normal limits in parenchymal echogenicity. IMPRESSION: 1.  No significant abnormality identified. Electronically Signed   By: Gaylyn RongWalter  Liebkemann M.D.   On: 09/16/2016 09:39    [place], DO  [facility] Pager [phone]  If 7PM-7AM, please contact night-coverage [email or web address] Password [email or web address] 09/17/2016, 9:23 AM   LOS: 1 day

## 2016-09-18 DIAGNOSIS — R7302 Impaired glucose tolerance (oral): Secondary | ICD-10-CM

## 2016-09-18 LAB — ANTINUCLEAR ANTIBODIES, IFA: ANTINUCLEAR ANTIBODIES, IFA: NEGATIVE

## 2016-09-18 MED ORDER — PROMETHAZINE HCL 25 MG/ML IJ SOLN
12.5000 mg | Freq: Once | INTRAMUSCULAR | Status: AC
  Administered 2016-09-18: 12.5 mg via INTRAVENOUS
  Filled 2016-09-18: qty 1

## 2016-09-18 NOTE — Progress Notes (Signed)
Pt has episodes of vomiting and nausea after drinking clear liquids. She is not tolerated PO fluids. PT tolerated jello well. Ice chips provided. Pt also reports Zofran works but only last a couple of hours. One dose of Phenergan administered. Continue to monitor.

## 2016-09-18 NOTE — Progress Notes (Signed)
PROGRESS NOTE  Gina Mccullough FOY:[id number] DOB: 08/31/1975 DOA: 09/16/2016 PCP: Patient, No Pcp Per   Brief History:  41 y.o.femalewith dyslipidemia presents with one-day history of epigastric abdominal pain that began on the evening of 09/15/2016. The patient had some nausea without emesis or diarrhea. She has not had any fevers, chills, dysuria, hematuria, hematochezia, melena. She denies any exacerbating or alleviating factors. She denies any recent travels or eating any unusual foods. She has not started any new medications, nor has she started taking any new supplements over-the-counter. She denies any alcohol or illegal drug use. In the emergency department, the patient was afebrile and hemodynamically stable. Lipase was 2003 with normal hepatic enzymes. The patient was admitted for treatment of pancreatitis.  Assessment/Plan: Acute pancreatitis -Etiology unclear -advance to full liquids -continue IVF -RUQ ultrasound--neg cholelithiasis or biliary ductal dilatation  -CT abd/pelvis--minimal peripancreatic stranding without any other acute findings  -lipid panel--LDL 100, triglycerides 63  -HIV--negative -ANA--negative  -pain control--increased morphine to 4 mg IV every 2 hours  Prn pain--pain improved -lipase 2003>>>69  Impaired glucose tolerance -Hemoglobin A1c--5.7  Back pain -MRI Lumbar spine--mild facet arthropathy   Disposition Plan:   Home 7/25 if tolerates soft diet Family Communication:  No Family at bedside  Consultants:  none  Code Status:  FULL   DVT Prophylaxis:  [place] Lovenox   Procedures: As Listed in Progress Note Above  Antibiotics: None   Subjective: Patient with episode of emesis last night with clear liquids, but has been tolerating them the rest of the day. She has some sensation of bloating when drinking, but denies any worsening abdominal pain. She states her abdominal pain is gradually getting better. Denies any fevers,  chills, chest pain or short of breath, dysuria, hematuria.  Objective: Vitals:   09/17/16 1812 09/17/16 2135 09/18/16 0418 09/18/16 1300  BP: 134/77 127/84 124/85 123/87  Pulse: 60 65 73 71  Resp: 18 16 16 17   Temp:  98.2 F (36.8 C) 98.2 F (36.8 C) 98.5 F (36.9 C)  TempSrc:  Oral Axillary Axillary  SpO2: 99% 100% 100% 96%  Weight:      Height:        Intake/Output Summary (Last 24 hours) at 09/18/16 1642 Last data filed at 09/18/16 1500  Gross per 24 hour  Intake          3075.42 ml  Output                0 ml  Net          3075.42 ml   Weight change:  Exam:   General:  Pt is alert, follows commands appropriately, not in acute distress  HEENT: No icterus, No thrush, No neck mass, [place]/AT  Cardiovascular: RRR, S1/S2, no rubs, no gallops  Respiratory: CTA bilaterally, no wheezing, no crackles, no rhonchi  Abdomen: Soft/+BS, Epigastric tender, non distended, no guarding  Extremities: No edema, No lymphangitis, No petechiae, No rashes, no synovitis   Data Reviewed: I have personally reviewed following labs and imaging studies Basic Metabolic Panel:  Recent Labs Lab 09/16/16 0554 09/17/16 1158  NA 138 135  K 3.8 3.6  CL 108 105  CO2 24 23  GLUCOSE 116* 142*  BUN 16 8  CREATININE 0.63 0.64  CALCIUM 9.5 8.6*   Liver Function Tests:  Recent Labs Lab 09/16/16 0554 09/17/16 1158  AST 22 21  ALT 21 17  ALKPHOS 86 79  BILITOT 1.0 0.8  PROT 7.7 7.1  ALBUMIN 3.8 3.3*    Recent Labs Lab 09/16/16 0554 09/17/16 1158  LIPASE 2,003* 69*   No results for input(s): AMMONIA in the last 168 hours. Coagulation Profile: No results for input(s): INR, PROTIME in the last 168 hours. CBC:  Recent Labs Lab 09/16/16 0554 09/17/16 1158  WBC 8.4 6.2  HGB 12.1 11.9*  HCT 36.5 35.9*  MCV 82.8 83.1  PLT 306 293   Cardiac Enzymes: No results for input(s): CKTOTAL, CKMB, CKMBINDEX, TROPONINI in the last 168 hours. BNP: Invalid input(s): POCBNP CBG: No  results for input(s): GLUCAP in the last 168 hours. HbA1C:  Recent Labs  09/16/16 1012  HGBA1C 5.7*   Urine analysis:    Component Value Date/Time   COLORURINE YELLOW 09/16/2016 0554   APPEARANCEUR HAZY (A) 09/16/2016 0554   LABSPEC 1.028 09/16/2016 0554   PHURINE 6.0 09/16/2016 0554   GLUCOSEU NEGATIVE 09/16/2016 0554   HGBUR LARGE (A) 09/16/2016 0554   BILIRUBINUR NEGATIVE 09/16/2016 0554   KETONESUR NEGATIVE 09/16/2016 0554   PROTEINUR 30 (A) 09/16/2016 0554   NITRITE NEGATIVE 09/16/2016 0554   LEUKOCYTESUR TRACE (A) 09/16/2016 0554   Sepsis Labs: @LABRCNTIP (procalcitonin:4,lacticidven:4) )No results found for this or any previous visit (from the past 240 hour(s)).   Scheduled Meds: . enoxaparin (LOVENOX) injection  40 mg Subcutaneous Q24H  . mouth rinse  15 mL Mouth Rinse BID   Continuous Infusions: . 0.9 % NaCl with KCl 20 mEq / L 125 mL/hr at 09/18/16 1144    Procedures/Studies: Mr Lumbar Spine Wo Contrast  Result Date: 09/17/2016 CLINICAL DATA:  Abdominal pain radiating to the upper back. No known injury. EXAM: MRI LUMBAR SPINE WITHOUT CONTRAST TECHNIQUE: Multiplanar, multisequence MR imaging of the lumbar spine was performed. No intravenous contrast was administered. COMPARISON:  None. FINDINGS: Segmentation:  Standard. Alignment:  Physiologic. Vertebrae:  No fracture, evidence of discitis, or bone lesion. Conus medullaris: Extends to the T12 level and appears normal. Paraspinal and other soft tissues: Negative. Disc levels: Disc spaces: Disc spaces are preserved. T12-L1: No significant disc bulge. No evidence of neural foraminal stenosis. No central canal stenosis. L1-L2: No significant disc bulge. No evidence of neural foraminal stenosis. No central canal stenosis. L2-L3: No significant disc bulge. No evidence of neural foraminal stenosis. No central canal stenosis. L3-L4: No significant disc bulge. Mild bilateral facet arthropathy. No evidence of neural foraminal  stenosis. No central canal stenosis. L4-L5: No significant disc bulge. No evidence of neural foraminal stenosis. No central canal stenosis. Mild bilateral facet arthropathy. L5-S1: No significant disc bulge. No evidence of neural foraminal stenosis. No central canal stenosis. IMPRESSION: 1. Mild bilateral facet arthropathy L3-4 and L4-5. 2. No significant disc protrusion, foraminal stenosis or central canal stenosis. Electronically Signed   By: Elige Ko   On: 09/17/2016 08:34   Ct Abdomen Pelvis W Contrast  Result Date: 09/16/2016 CLINICAL DATA:  Right abdominal pain, acute pancreatitis EXAM: CT ABDOMEN AND PELVIS WITH CONTRAST TECHNIQUE: Multidetector CT imaging of the abdomen and pelvis was performed using the standard protocol following bolus administration of intravenous contrast. CONTRAST:  ISOVUE-300 IOPAMIDOL (ISOVUE-300) INJECTION 61% COMPARISON:  None. FINDINGS: Lower chest: Lung bases are clear. Hepatobiliary: Liver is within normal limits. Gallbladder is unremarkable. No intrahepatic extrahepatic duct dilatation. Pancreas: Pancreas is essentially within normal limits on CT. Very minimal peripancreatic inflammatory changes (for example, series 2/ image 19). No pancreatic mass or ductal dilatation. No peripancreatic fluid collection/pseudocyst. Spleen: Within normal limits. Adrenals/Urinary Tract: Adrenal glands  are within normal limits. Kidneys are within normal limits.  No hydronephrosis. Bladder is underdistended but unremarkable. Stomach/Bowel: Stomach is within normal limits. No evidence of bowel obstruction. Normal appendix (series 2/ image 45). Left colon is decompressed. Vascular/Lymphatic: No evidence of abdominal aortic aneurysm. No suspicious abdominopelvic lymphadenopathy. Reproductive: Uterus is unremarkable.  Bilateral Essure implants. Left ovary is within normal limits.  No right adnexal mass. Other: No abdominopelvic ascites. Mild diastases of the midline anterior abdominal  wall. Musculoskeletal: Visualized osseous structures are within normal limits. IMPRESSION: Very minimal peripancreatic inflammatory changes on CT. No peripancreatic fluid collection/pseudocyst. Electronically Signed   By: Charline Bills M.D.   On: 09/16/2016 10:18   [place] Abdomen Limited Ruq  Result Date: 09/16/2016 CLINICAL DATA:  Mid to upper abdominal pain for 1 day. EXAM: ULTRASOUND ABDOMEN LIMITED RIGHT UPPER QUADRANT COMPARISON:  None. FINDINGS: Gallbladder: No gallstones or wall thickening visualized. No sonographic Murphy sign noted by sonographer. Common bile duct: Diameter: 2 mm Liver: No focal lesion identified. Within normal limits in parenchymal echogenicity. IMPRESSION: 1.  No significant abnormality identified. Electronically Signed   By: Gaylyn Rong M.D.   On: 09/16/2016 09:39    Kellen Dutch, DO  [facility] Pager [phone]  If 7PM-7AM, please contact night-coverage [email or web address] Password [email or web address] 09/18/2016, 4:42 PM   LOS: 2 days

## 2016-09-19 ENCOUNTER — Encounter: Payer: Self-pay | Admitting: Gastroenterology

## 2016-09-19 ENCOUNTER — Telehealth: Payer: Self-pay | Admitting: Gastroenterology

## 2016-09-19 DIAGNOSIS — K859 Acute pancreatitis without necrosis or infection, unspecified: Principal | ICD-10-CM

## 2016-09-19 DIAGNOSIS — R7302 Impaired glucose tolerance (oral): Secondary | ICD-10-CM

## 2016-09-19 DIAGNOSIS — M5416 Radiculopathy, lumbar region: Secondary | ICD-10-CM

## 2016-09-19 LAB — MAGNESIUM: Magnesium: 1.7 mg/dL (ref 1.7–2.4)

## 2016-09-19 LAB — BASIC METABOLIC PANEL
Anion gap: 7 (ref 5–15)
BUN: 7 mg/dL (ref 6–20)
CALCIUM: 8.8 mg/dL — AB (ref 8.9–10.3)
CHLORIDE: 103 mmol/L (ref 101–111)
CO2: 25 mmol/L (ref 22–32)
CREATININE: 0.65 mg/dL (ref 0.44–1.00)
GFR calc non Af Amer: >60 mL/min (ref >60)
GLUCOSE: 100 mg/dL — AB (ref 65–99)
Potassium: 3.5 mmol/L (ref 3.5–5.1)
Sodium: 135 mmol/L (ref 135–145)

## 2016-09-19 LAB — LIPASE, BLOOD: Lipase: 101 U/L — ABNORMAL HIGH (ref 11–51)

## 2016-09-19 MED ORDER — SIMETHICONE 80 MG PO CHEW
80.0000 mg | CHEWABLE_TABLET | Freq: Four times a day (QID) | ORAL | Status: DC | PRN
  Administered 2016-09-19 (×2): 80 mg via ORAL
  Filled 2016-09-19 (×2): qty 1

## 2016-09-19 NOTE — Discharge Planning (Deleted)
Physician Discharge Summary  Gina Mccullough WUJ:[id number] DOB: 1975-11-16 DOA: 09/16/2016  PCP: Patient, No Pcp Per  Admit date: 09/16/2016 Discharge date: 09/19/2016  Admitted From: home Disposition:  home  Recommendations for Outpatient Follow-up:  1. Follow up with PCP in 1-2 weeks 2. Please obtain BMP/CBC in one week 3. Patient has been referred to gastroenterology for further work up of pancreatitis  Home Health:  Equipment/Devices:  Discharge Condition: stable CODE STATUS: full code Diet recommendation: Heart Healthy   Brief/Interim Summary: 41 y.o.femalewith dyslipidemia presents with one-day history of epigastric abdominal pain that began on the evening of 09/15/2016. The patient had some nausea without emesis or diarrhea. She has not had any fevers, chills, dysuria, hematuria, hematochezia, melena. She denies any exacerbating or alleviating factors. She denies any recent travels or eating any unusual foods. She has not started any new medications, nor has she started taking any new supplements over-the-counter. She denies any alcohol or illegal drug use. In the emergency department, the patient was afebrile and hemodynamically stable. Lipase was 2003 with normal hepatic enzymes. The patient was admitted for treatment of pancreatitis.  She was treated with IV fluids and pain management. Diet was advanced as she is not tolerating a solid diet. Etiology of pancreatitis is unclear. Triglycerides were noted to be in normal range. She did not have any evidence of gallstones on imaging. She denies any alcohol use. Clinically she is feeling better and will be discharged home today. She is referred to gastroenterology as an outpatient for further workup of pancreatitis  Discharge Diagnoses:  Active Problems:   Acute pancreatitis   Impaired glucose tolerance    Discharge Instructions  Discharge Instructions    Diet - low sodium heart healthy    Complete by:  As directed     Increase activity slowly    Complete by:  As directed      Allergies as of 09/19/2016   No Known Allergies     Medication List    TAKE these medications   Cholecalciferol 5000 units capsule Take 1 capsule (5,000 Units total) by mouth daily.   imiquimod 5 % cream Commonly known as:  ALDARA Apply topically 3 (three) times a week.   vitamin B-12 100 MCG tablet Commonly known as:  CYANOCOBALAMIN Take 100 mcg by mouth daily.       No Known Allergies  Consultations:     Procedures/Studies: Mr Lumbar Spine Wo Contrast  Result Date: 09/17/2016 CLINICAL DATA:  Abdominal pain radiating to the upper back. No known injury. EXAM: MRI LUMBAR SPINE WITHOUT CONTRAST TECHNIQUE: Multiplanar, multisequence MR imaging of the lumbar spine was performed. No intravenous contrast was administered. COMPARISON:  None. FINDINGS: Segmentation:  Standard. Alignment:  Physiologic. Vertebrae:  No fracture, evidence of discitis, or bone lesion. Conus medullaris: Extends to the T12 level and appears normal. Paraspinal and other soft tissues: Negative. Disc levels: Disc spaces: Disc spaces are preserved. T12-L1: No significant disc bulge. No evidence of neural foraminal stenosis. No central canal stenosis. L1-L2: No significant disc bulge. No evidence of neural foraminal stenosis. No central canal stenosis. L2-L3: No significant disc bulge. No evidence of neural foraminal stenosis. No central canal stenosis. L3-L4: No significant disc bulge. Mild bilateral facet arthropathy. No evidence of neural foraminal stenosis. No central canal stenosis. L4-L5: No significant disc bulge. No evidence of neural foraminal stenosis. No central canal stenosis. Mild bilateral facet arthropathy. L5-S1: No significant disc bulge. No evidence of neural foraminal stenosis. No central canal stenosis. IMPRESSION: 1.  Mild bilateral facet arthropathy L3-4 and L4-5. 2. No significant disc protrusion, foraminal stenosis or central canal  stenosis. Electronically Signed   By: Elige KoHetal  Patel   On: 09/17/2016 08:34   Ct Abdomen Pelvis W Contrast  Result Date: 09/16/2016 CLINICAL DATA:  Right abdominal pain, acute pancreatitis EXAM: CT ABDOMEN AND PELVIS WITH CONTRAST TECHNIQUE: Multidetector CT imaging of the abdomen and pelvis was performed using the standard protocol following bolus administration of intravenous contrast. CONTRAST:  100mL ISOVUE-300 IOPAMIDOL (ISOVUE-300) INJECTION 61% COMPARISON:  None. FINDINGS: Lower chest: Lung bases are clear. Hepatobiliary: Liver is within normal limits. Gallbladder is unremarkable. No intrahepatic extrahepatic duct dilatation. Pancreas: Pancreas is essentially within normal limits on CT. Very minimal peripancreatic inflammatory changes (for example, series 2/ image 19). No pancreatic mass or ductal dilatation. No peripancreatic fluid collection/pseudocyst. Spleen: Within normal limits. Adrenals/Urinary Tract: Adrenal glands are within normal limits. Kidneys are within normal limits.  No hydronephrosis. Bladder is underdistended but unremarkable. Stomach/Bowel: Stomach is within normal limits. No evidence of bowel obstruction. Normal appendix (series 2/ image 45). Left colon is decompressed. Vascular/Lymphatic: No evidence of abdominal aortic aneurysm. No suspicious abdominopelvic lymphadenopathy. Reproductive: Uterus is unremarkable.  Bilateral Essure implants. Left ovary is within normal limits.  No right adnexal mass. Other: No abdominopelvic ascites. Mild diastases of the midline anterior abdominal wall. Musculoskeletal: Visualized osseous structures are within normal limits. IMPRESSION: Very minimal peripancreatic inflammatory changes on CT. No peripancreatic fluid collection/pseudocyst. Electronically Signed   By: Charline BillsSriyesh  Krishnan M.D.   On: 09/16/2016 10:18   [place] Abdomen Limited Ruq  Result Date: 09/16/2016 CLINICAL DATA:  Mid to upper abdominal pain for 1 day. EXAM: ULTRASOUND ABDOMEN LIMITED  RIGHT UPPER QUADRANT COMPARISON:  None. FINDINGS: Gallbladder: No gallstones or wall thickening visualized. No sonographic Murphy sign noted by sonographer. Common bile duct: Diameter: 2 mm Liver: No focal lesion identified. Within normal limits in parenchymal echogenicity. IMPRESSION: 1.  No significant abnormality identified. Electronically Signed   By: Gaylyn RongWalter  Liebkemann M.D.   On: 09/16/2016 09:39       Subjective: Feeling better. Tolerated breakfast this morning. No vomiting. Abdominal pain is controlled.  Discharge Exam: Vitals:   09/18/16 2109 09/19/16 0441  BP: 138/81 133/90  Pulse: 65 72  Resp: 16 16  Temp: 98.4 F (36.9 C) 98.2 F (36.8 C)   Vitals:   09/18/16 1300 09/18/16 1949 09/18/16 2109 09/19/16 0441  BP: 123/87  138/81 133/90  Pulse: 71  65 72  Resp: 17  16 16   Temp: 98.5 F (36.9 C)  98.4 F (36.9 C) 98.2 F (36.8 C)  TempSrc: Axillary  Oral Oral  SpO2: 96% 96% 98% 100%  Weight:      Height:        General: Pt is alert, awake, not in acute distress Cardiovascular: RRR, S1/S2 +, no rubs, no gallops Respiratory: CTA bilaterally, no wheezing, no rhonchi Abdominal: Soft, NT, ND, bowel sounds + Extremities: no edema, no cyanosis    The results of significant diagnostics from this hospitalization (including imaging, microbiology, ancillary and laboratory) are listed below for reference.     Microbiology: No results found for this or any previous visit (from the past 240 hour(s)).   Labs: BNP (last 3 results) No results for input(s): BNP in the last 8760 hours. Basic Metabolic Panel:  Recent Labs Lab 09/16/16 0554 09/17/16 1158 09/19/16 0559  NA 138 135 135  K 3.8 3.6 3.5  CL 108 105 103  CO2 24 23 25  GLUCOSE 116* 142* 100*  BUN 16 8 7   CREATININE 0.63 0.64 0.65  CALCIUM 9.5 8.6* 8.8*  MG  --   --  1.7   Liver Function Tests:  Recent Labs Lab 09/16/16 0554 09/17/16 1158  AST 22 21  ALT 21 17  ALKPHOS 86 79  BILITOT 1.0 0.8   PROT 7.7 7.1  ALBUMIN 3.8 3.3*    Recent Labs Lab 09/16/16 0554 09/17/16 1158 09/19/16 0559  LIPASE 2,003* 69* 101*   No results for input(s): AMMONIA in the last 168 hours. CBC:  Recent Labs Lab 09/16/16 0554 09/17/16 1158  WBC 8.4 6.2  HGB 12.1 11.9*  HCT 36.5 35.9*  MCV 82.8 83.1  PLT 306 293   Cardiac Enzymes: No results for input(s): CKTOTAL, CKMB, CKMBINDEX, TROPONINI in the last 168 hours. BNP: Invalid input(s): POCBNP CBG: No results for input(s): GLUCAP in the last 168 hours. D-Dimer No results for input(s): DDIMER in the last 72 hours. Hgb A1c No results for input(s): HGBA1C in the last 72 hours. Lipid Profile No results for input(s): CHOL, HDL, LDLCALC, TRIG, CHOLHDL, LDLDIRECT in the last 72 hours. Thyroid function studies No results for input(s): TSH, T4TOTAL, T3FREE, THYROIDAB in the last 72 hours.  Invalid input(s): FREET3 Anemia work up No results for input(s): VITAMINB12, FOLATE, FERRITIN, TIBC, IRON, RETICCTPCT in the last 72 hours. Urinalysis    Component Value Date/Time   COLORURINE YELLOW 09/16/2016 0554   APPEARANCEUR HAZY (A) 09/16/2016 0554   LABSPEC 1.028 09/16/2016 0554   PHURINE 6.0 09/16/2016 0554   GLUCOSEU NEGATIVE 09/16/2016 0554   HGBUR LARGE (A) 09/16/2016 0554   BILIRUBINUR NEGATIVE 09/16/2016 0554   KETONESUR NEGATIVE 09/16/2016 0554   PROTEINUR 30 (A) 09/16/2016 0554   NITRITE NEGATIVE 09/16/2016 0554   LEUKOCYTESUR TRACE (A) 09/16/2016 0554   Sepsis Labs Invalid input(s): PROCALCITONIN,  WBC,  LACTICIDVEN Microbiology No results found for this or any previous visit (from the past 240 hour(s)).   Time coordinating discharge: Over 30 minutes  SIGNED:   Erick BlinksMEMON,Elvira Langston, MD  [facility] 09/19/2016, 11:47 AM Pager   If 7PM-7AM, please contact night-coverage [email or web address] Password [email or web address]

## 2016-09-19 NOTE — Telephone Encounter (Signed)
OV made and letter mailed °

## 2016-09-19 NOTE — Telephone Encounter (Signed)
Yes

## 2016-09-19 NOTE — Telephone Encounter (Signed)
Can we accepted her as a new patient?

## 2016-09-19 NOTE — Progress Notes (Signed)
Pt IV removed, per NT, tolerated well.  Reviewed discharge instructions with pt and answered all quesitons at this time.

## 2016-09-20 NOTE — Discharge Summary (Signed)
Physician Discharge Summary  Gina Mccullough ZOX:[id number] DOB: 1975-12-28 DOA: 09/16/2016  PCP: Patient, No Pcp Per  Admit date: 09/16/2016 Discharge date: 09/20/2016  Admitted From: home Disposition:  home  Recommendations for Outpatient Follow-up:  1. Follow up with PCP in 1-2 weeks 2. Please obtain BMP/CBC in one week 3. Patient has been referred to gastroenterology for further work up of pancreatitis  Home Health:  Equipment/Devices:  Discharge Condition: stable CODE STATUS: full code Diet recommendation: Heart Healthy   Brief/Interim Summary: 41 y.o.femalewith dyslipidemia presents with one-day history of epigastric abdominal pain that began on the evening of 09/15/2016. The patient had some nausea without emesis or diarrhea. She has not had any fevers, chills, dysuria, hematuria, hematochezia, melena. She denies any exacerbating or alleviating factors. She denies any recent travels or eating any unusual foods. She has not started any new medications, nor has she started taking any new supplements over-the-counter. She denies any alcohol or illegal drug use. In the emergency department, the patient was afebrile and hemodynamically stable. Lipase was 2003 with normal hepatic enzymes. The patient was admitted for treatment of pancreatitis.  She was treated with IV fluids and pain management. Diet was advanced as she is not tolerating a solid diet. Etiology of pancreatitis is unclear. Triglycerides were noted to be in normal range. She did not have any evidence of gallstones on imaging. She denies any alcohol use. Clinically she is feeling better and will be discharged home today. She is referred to gastroenterology as an outpatient for further workup of pancreatitis  Discharge Diagnoses:  Active Problems:   Acute pancreatitis   Impaired glucose tolerance   Lumbar radiculopathy    Discharge Instructions  Discharge Instructions    Diet - low sodium heart healthy    Complete by:   As directed    Increase activity slowly    Complete by:  As directed      Allergies as of 09/19/2016   No Known Allergies     Medication List    TAKE these medications   Cholecalciferol 5000 units capsule Take 1 capsule (5,000 Units total) by mouth daily.   imiquimod 5 % cream Commonly known as:  ALDARA Apply topically 3 (three) times a week. Notes to patient:  Use as directed   vitamin B-12 100 MCG tablet Commonly known as:  CYANOCOBALAMIN Take 100 mcg by mouth daily.       No Known Allergies  Consultations:     Procedures/Studies: Mr Lumbar Spine Wo Contrast  Result Date: 09/17/2016 CLINICAL DATA:  Abdominal pain radiating to the upper back. No known injury. EXAM: MRI LUMBAR SPINE WITHOUT CONTRAST TECHNIQUE: Multiplanar, multisequence MR imaging of the lumbar spine was performed. No intravenous contrast was administered. COMPARISON:  None. FINDINGS: Segmentation:  Standard. Alignment:  Physiologic. Vertebrae:  No fracture, evidence of discitis, or bone lesion. Conus medullaris: Extends to the T12 level and appears normal. Paraspinal and other soft tissues: Negative. Disc levels: Disc spaces: Disc spaces are preserved. T12-L1: No significant disc bulge. No evidence of neural foraminal stenosis. No central canal stenosis. L1-L2: No significant disc bulge. No evidence of neural foraminal stenosis. No central canal stenosis. L2-L3: No significant disc bulge. No evidence of neural foraminal stenosis. No central canal stenosis. L3-L4: No significant disc bulge. Mild bilateral facet arthropathy. No evidence of neural foraminal stenosis. No central canal stenosis. L4-L5: No significant disc bulge. No evidence of neural foraminal stenosis. No central canal stenosis. Mild bilateral facet arthropathy. L5-S1: No significant disc bulge. No  evidence of neural foraminal stenosis. No central canal stenosis. IMPRESSION: 1. Mild bilateral facet arthropathy L3-4 and L4-5. 2. No significant disc  protrusion, foraminal stenosis or central canal stenosis. Electronically Signed   By: Elige Ko   On: 09/17/2016 08:34   Ct Abdomen Pelvis W Contrast  Result Date: 09/16/2016 CLINICAL DATA:  Right abdominal pain, acute pancreatitis EXAM: CT ABDOMEN AND PELVIS WITH CONTRAST TECHNIQUE: Multidetector CT imaging of the abdomen and pelvis was performed using the standard protocol following bolus administration of intravenous contrast. CONTRAST:  ISOVUE-300 IOPAMIDOL (ISOVUE-300) INJECTION 61% COMPARISON:  None. FINDINGS: Lower chest: Lung bases are clear. Hepatobiliary: Liver is within normal limits. Gallbladder is unremarkable. No intrahepatic extrahepatic duct dilatation. Pancreas: Pancreas is essentially within normal limits on CT. Very minimal peripancreatic inflammatory changes (for example, series 2/ image 19). No pancreatic mass or ductal dilatation. No peripancreatic fluid collection/pseudocyst. Spleen: Within normal limits. Adrenals/Urinary Tract: Adrenal glands are within normal limits. Kidneys are within normal limits.  No hydronephrosis. Bladder is underdistended but unremarkable. Stomach/Bowel: Stomach is within normal limits. No evidence of bowel obstruction. Normal appendix (series 2/ image 45). Left colon is decompressed. Vascular/Lymphatic: No evidence of abdominal aortic aneurysm. No suspicious abdominopelvic lymphadenopathy. Reproductive: Uterus is unremarkable.  Bilateral Essure implants. Left ovary is within normal limits.  No right adnexal mass. Other: No abdominopelvic ascites. Mild diastases of the midline anterior abdominal wall. Musculoskeletal: Visualized osseous structures are within normal limits. IMPRESSION: Very minimal peripancreatic inflammatory changes on CT. No peripancreatic fluid collection/pseudocyst. Electronically Signed   By: Charline Bills M.D.   On: 09/16/2016 10:18   [place] Abdomen Limited Ruq  Result Date: 09/16/2016 CLINICAL DATA:  Mid to upper abdominal pain  for 1 day. EXAM: ULTRASOUND ABDOMEN LIMITED RIGHT UPPER QUADRANT COMPARISON:  None. FINDINGS: Gallbladder: No gallstones or wall thickening visualized. No sonographic Murphy sign noted by sonographer. Common bile duct: Diameter: 2 mm Liver: No focal lesion identified. Within normal limits in parenchymal echogenicity. IMPRESSION: 1.  No significant abnormality identified. Electronically Signed   By: Gaylyn Rong M.D.   On: 09/16/2016 09:39      Subjective: Feeling better. Tolerated breakfast this morning. No vomiting. Abdominal pain is controlled.  Discharge Exam: Vitals:   09/18/16 2109 09/19/16 0441  BP: 138/81 133/90  Pulse: 65 72  Resp: 16 16  Temp: 98.4 F (36.9 C) 98.2 F (36.8 C)   Vitals:   09/18/16 1300 09/18/16 1949 09/18/16 2109 09/19/16 0441  BP: 123/87  138/81 133/90  Pulse: 71  65 72  Resp: 17  16 16   Temp: 98.5 F (36.9 C)  98.4 F (36.9 C) 98.2 F (36.8 C)  TempSrc: Axillary  Oral Oral  SpO2: 96% 96% 98% 100%  Weight:      Height:        General: Pt is alert, awake, not in acute distress Cardiovascular: RRR, S1/S2 +, no rubs, no gallops Respiratory: CTA bilaterally, no wheezing, no rhonchi Abdominal: Soft, NT, ND, bowel sounds + Extremities: no edema, no cyanosis    The results of significant diagnostics from this hospitalization (including imaging, microbiology, ancillary and laboratory) are listed below for reference.     Microbiology: No results found for this or any previous visit (from the past 240 hour(s)).   Labs: BNP (last 3 results) No results for input(s): BNP in the last 8760 hours. Basic Metabolic Panel:  Recent Labs Lab 09/16/16 0554 09/17/16 1158 09/19/16 0559  NA 138 135 135  K 3.8 3.6 3.5  CL 108 105 103  CO2 24 23 25   GLUCOSE 116* 142* 100*  BUN 16 8 7   CREATININE 0.63 0.64 0.65  CALCIUM 9.5 8.6* 8.8*  MG  --   --  1.7   Liver Function Tests:  Recent Labs Lab 09/16/16 0554 09/17/16 1158  AST 22 21  ALT 21  17  ALKPHOS 86 79  BILITOT 1.0 0.8  PROT 7.7 7.1  ALBUMIN 3.8 3.3*    Recent Labs Lab 09/16/16 0554 09/17/16 1158 09/19/16 0559  LIPASE 2,003* 69* 101*   No results for input(s): AMMONIA in the last 168 hours. CBC:  Recent Labs Lab 09/16/16 0554 09/17/16 1158  WBC 8.4 6.2  HGB 12.1 11.9*  HCT 36.5 35.9*  MCV 82.8 83.1  PLT 306 293   Cardiac Enzymes: No results for input(s): CKTOTAL, CKMB, CKMBINDEX, TROPONINI in the last 168 hours. BNP: Invalid input(s): POCBNP CBG: No results for input(s): GLUCAP in the last 168 hours. D-Dimer No results for input(s): DDIMER in the last 72 hours. Hgb A1c No results for input(s): HGBA1C in the last 72 hours. Lipid Profile No results for input(s): CHOL, HDL, LDLCALC, TRIG, CHOLHDL, LDLDIRECT in the last 72 hours. Thyroid function studies No results for input(s): TSH, T4TOTAL, T3FREE, THYROIDAB in the last 72 hours.  Invalid input(s): FREET3 Anemia work up No results for input(s): VITAMINB12, FOLATE, FERRITIN, TIBC, IRON, RETICCTPCT in the last 72 hours. Urinalysis    Component Value Date/Time   COLORURINE YELLOW 09/16/2016 0554   APPEARANCEUR HAZY (A) 09/16/2016 0554   LABSPEC 1.028 09/16/2016 0554   PHURINE 6.0 09/16/2016 0554   GLUCOSEU NEGATIVE 09/16/2016 0554   HGBUR LARGE (A) 09/16/2016 0554   BILIRUBINUR NEGATIVE 09/16/2016 0554   KETONESUR NEGATIVE 09/16/2016 0554   PROTEINUR 30 (A) 09/16/2016 0554   NITRITE NEGATIVE 09/16/2016 0554   LEUKOCYTESUR TRACE (A) 09/16/2016 0554   Sepsis Labs Invalid input(s): PROCALCITONIN,  WBC,  LACTICIDVEN Microbiology No results found for this or any previous visit (from the past 240 hour(s)).   Time coordinating discharge: Over 30 minutes  SIGNED:   Erick BlinksMEMON,Farrin Shadle, MD  [facility] 09/20/2016, 9:15 AM Pager   If 7PM-7AM, please contact night-coverage [email or web address] Password [email or web address]

## 2016-09-24 ENCOUNTER — Telehealth: Payer: Self-pay | Admitting: Gastroenterology

## 2016-09-24 NOTE — Telephone Encounter (Signed)
Pt was referred by the ER and has OV with AB on 9/19. Pt does not have a PCP and needs FMLA forms filled out ASAP so she doesn't loose her insurance. I told her that we do not have any opening any sooner than Sept 19th and suggested that she find a PCP and gave her the number to the [facility]. She agreed and I told her she needed to establish a PCP.

## 2016-11-14 ENCOUNTER — Ambulatory Visit: Payer: BLUE CROSS/BLUE SHIELD | Admitting: Gastroenterology

## 2017-04-11 ENCOUNTER — Encounter: Payer: Self-pay | Admitting: Adult Health

## 2017-04-11 ENCOUNTER — Ambulatory Visit (INDEPENDENT_AMBULATORY_CARE_PROVIDER_SITE_OTHER): Payer: Commercial Managed Care - PPO | Admitting: Adult Health

## 2017-04-11 ENCOUNTER — Encounter (INDEPENDENT_AMBULATORY_CARE_PROVIDER_SITE_OTHER): Payer: Self-pay

## 2017-04-11 ENCOUNTER — Other Ambulatory Visit (HOSPITAL_COMMUNITY)
Admission: RE | Admit: 2017-04-11 | Discharge: 2017-04-11 | Disposition: A | Discharge status: Home / Self Care | Payer: Commercial Managed Care - PPO | Source: Ambulatory Visit | Attending: Adult Health | Admitting: Adult Health

## 2017-04-11 VITALS — BP 140/86 | HR 88 | Ht 65.0 in | Wt 237.0 lb

## 2017-04-11 DIAGNOSIS — Z8619 Personal history of other infectious and parasitic diseases: Secondary | ICD-10-CM

## 2017-04-11 DIAGNOSIS — Z01419 Encounter for gynecological examination (general) (routine) without abnormal findings: Secondary | ICD-10-CM | POA: Diagnosis present

## 2017-04-11 DIAGNOSIS — Z01411 Encounter for gynecological examination (general) (routine) with abnormal findings: Secondary | ICD-10-CM

## 2017-04-11 DIAGNOSIS — Z113 Encounter for screening for infections with a predominantly sexual mode of transmission: Secondary | ICD-10-CM

## 2017-04-11 DIAGNOSIS — R7309 Other abnormal glucose: Secondary | ICD-10-CM | POA: Diagnosis not present

## 2017-04-11 DIAGNOSIS — Z1212 Encounter for screening for malignant neoplasm of rectum: Secondary | ICD-10-CM

## 2017-04-11 DIAGNOSIS — Z1211 Encounter for screening for malignant neoplasm of colon: Secondary | ICD-10-CM | POA: Diagnosis not present

## 2017-04-11 DIAGNOSIS — E559 Vitamin D deficiency, unspecified: Secondary | ICD-10-CM

## 2017-04-11 DIAGNOSIS — E785 Hyperlipidemia, unspecified: Secondary | ICD-10-CM | POA: Diagnosis not present

## 2017-04-11 LAB — HEMOCCULT GUIAC POC 1CARD (OFFICE): Fecal Occult Blood, POC: NEGATIVE

## 2017-04-11 MED ORDER — IMIQUIMOD 5 % EX CREA: TOPICAL_CREAM | CUTANEOUS | 1 refills | Status: DC

## 2017-04-11 NOTE — Progress Notes (Signed)
Patient ID: Gina Mccullough, female   DOB: [birth year], 42 y.o.   MRN: [id number] History of Present Illness:  Gina Mccullough is a 42 year old black female, married, in for well woman gyn exam and pap.  She works at [employer].  Current Medications, Allergies, Past Medical History, Past Surgical History, Family History and Social History were reviewed in [facility] electronic medical record.     Review of Systems:  Patient denies any headaches, hearing loss, fatigue, blurred vision, shortness of breath, chest pain, abdominal pain, problems with bowel movements, urination, or intercourse. No joint pain or mood swings.+odor after sex,like ammonia  History of warts.  Physical Exam:BP 140/86 (BP Location: Left Arm, Patient Position: Sitting, Cuff Size: Normal)   Pulse 88   Ht 5\' 5"  (1.651 m)   Wt 237 lb (107.5 kg)   LMP 04/01/2017 (Exact Date)   BMI 39.44 kg/m  General:  Well developed, well nourished, no acute distress Skin:  Warm and dry Neck:  Midline trachea, normal thyroid, good ROM, no lymphadenopathy Lungs; Clear to auscultation bilaterally Breast:  No dominant palpable mass, retraction, or nipple discharge Cardiovascular: Regular rate and rhythm Abdomen:  Soft, non tender, no hepatosplenomegaly Pelvic:  External genitalia is normal in appearance, no lesions.  The vagina is normal in appearance. Urethra has no lesions or masses. The cervix is smooth, pap with GC/CHL and HPV performed.  Uterus is felt to be normal size, shape, and contour.  No adnexal masses or tenderness noted.Bladder is non tender, no masses felt. Rectal: Good sphincter tone, no polyps, or hemorrhoids felt.  Hemoccult negative. Extremities/musculoskeletal:  No swelling or varicosities noted, no clubbing or cyanosis Psych:  No mood changes, alert and cooperative,seems happy PHQ 2 score 0. She requests refill on Aldara, to have if warts appear.   Impression: 1. Encounter for gynecological examination with Papanicolaou smear of  cervix   2. Screening for colorectal cancer   3. Screening for STD (sexually transmitted disease)   4. Vitamin D deficiency   5. Dyslipidemia   6.      Elevated A1c  7.      History of genital warts    Plan: Check HIV and RPR Check CBC,CMP,TSH and lipids,A1c and vitamin D  Physical in 1 year Pap in 3 if normal Get mammogram now and yearly Meds ordered this encounter  Medications  . imiquimod (ALDARA) 5 % cream    Sig: Apply topically 3 (three) times a week.    Dispense:  12 each    Refill:  1    Order Specific Question:   Supervising Provider    Answer:   Duane LopeEURE, LUTHER H [[id number]]

## 2017-04-12 ENCOUNTER — Telehealth: Payer: Self-pay | Admitting: Adult Health

## 2017-04-12 LAB — HEMOGLOBIN A1C
Est. average glucose Bld gHb Est-mCnc: 120 mg/dL
Hgb A1c MFr Bld: 5.8 % — ABNORMAL HIGH (ref 4.8–5.6)

## 2017-04-12 LAB — LIPID PANEL
CHOLESTEROL TOTAL: 161 mg/dL (ref 100–199)
Chol/HDL Ratio: 4 ratio (ref 0.0–4.4)
HDL: 40 mg/dL (ref >39)
LDL CALC: 98 mg/dL (ref 0–99)
TRIGLYCERIDES: 117 mg/dL (ref 0–149)
VLDL CHOLESTEROL CAL: 23 mg/dL (ref 5–40)

## 2017-04-12 LAB — CBC
HEMATOCRIT: 38.7 % (ref 34.0–46.6)
Hemoglobin: 12.6 g/dL (ref 11.1–15.9)
MCH: 27.3 pg (ref 26.6–33.0)
MCHC: 32.6 g/dL (ref 31.5–35.7)
MCV: 84 fL (ref 79–97)
Platelets: 382 10*3/uL — ABNORMAL HIGH (ref 150–379)
RBC: 4.61 x10E6/uL (ref 3.77–5.28)
RDW: 14.7 % (ref 12.3–15.4)
WBC: 7.1 10*3/uL (ref 3.4–10.8)

## 2017-04-12 LAB — COMPREHENSIVE METABOLIC PANEL
ALK PHOS: 100 IU/L (ref 39–117)
ALT: 17 IU/L (ref 0–32)
AST: 14 IU/L (ref 0–40)
Albumin/Globulin Ratio: 1.2 (ref 1.2–2.2)
Albumin: 3.8 g/dL (ref 3.5–5.5)
BUN/Creatinine Ratio: 20 (ref 9–23)
BUN: 16 mg/dL (ref 6–24)
Bilirubin Total: 0.4 mg/dL (ref 0.0–1.2)
CO2: 20 mmol/L (ref 20–29)
CREATININE: 0.81 mg/dL (ref 0.57–1.00)
Calcium: 9.3 mg/dL (ref 8.7–10.2)
Chloride: 104 mmol/L (ref 96–106)
GFR calc Af Amer: 105 mL/min/{1.73_m2} (ref >59)
GFR calc non Af Amer: 91 mL/min/{1.73_m2} (ref >59)
GLOBULIN, TOTAL: 3.3 g/dL (ref 1.5–4.5)
GLUCOSE: 104 mg/dL — AB (ref 65–99)
Potassium: 4.2 mmol/L (ref 3.5–5.2)
SODIUM: 139 mmol/L (ref 134–144)
Total Protein: 7.1 g/dL (ref 6.0–8.5)

## 2017-04-12 LAB — VITAMIN D 25 HYDROXY (VIT D DEFICIENCY, FRACTURES): Vit D, 25-Hydroxy: 56.5 ng/mL (ref 30.0–100.0)

## 2017-04-12 LAB — HIV ANTIBODY (ROUTINE TESTING W REFLEX): HIV Screen 4th Generation wRfx: NONREACTIVE

## 2017-04-12 LAB — TSH: TSH: 1.96 u[IU]/mL (ref 0.450–4.500)

## 2017-04-12 LAB — RPR: RPR: NONREACTIVE

## 2017-04-12 NOTE — Telephone Encounter (Signed)
Pt aware of labs, decrease carbs and increase activity

## 2017-04-14 LAB — CYTOLOGY - PAP
ADEQUACY: ABSENT
Chlamydia: NEGATIVE
Diagnosis: NEGATIVE
HPV: NOT DETECTED
Neisseria Gonorrhea: NEGATIVE

## 2017-08-21 IMAGING — MR MR LUMBAR SPINE W/O CM
4 of 5 series · 16 of 48 positions shown · non-contrast
Comparison: None.

CLINICAL DATA: Abdominal pain radiating to the upper back. No known
injury.

EXAM:
MRI LUMBAR SPINE WITHOUT CONTRAST
TECHNIQUE: Multiplanar, multisequence MR imaging of the lumbar spine was
performed. No intravenous contrast was administered.

[Series 3: T2 · sagittal · 4.0mm · 0.78mm/px · 7 of 15 slices shown (1 of 2)]
[im 1/15]
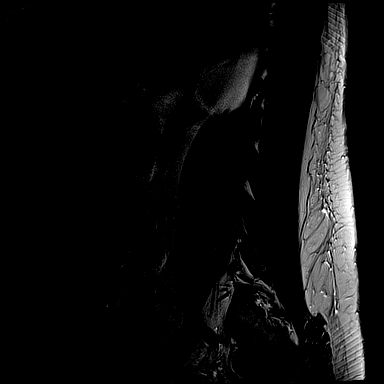
[im 3/15]
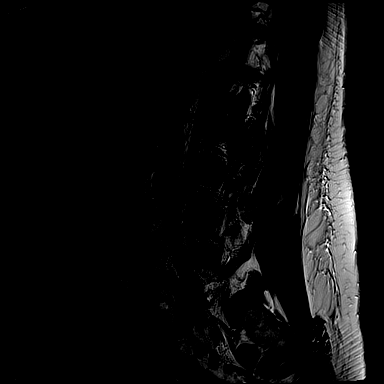
[im 5/15]
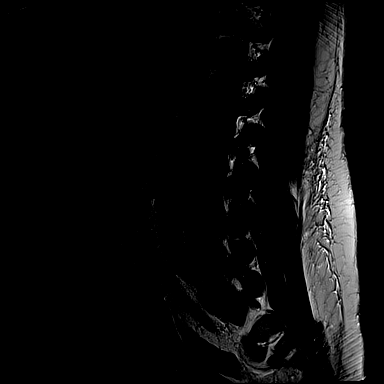
[im 8/15]
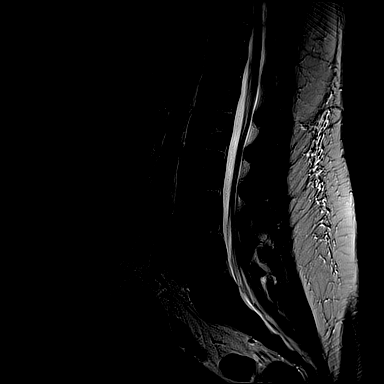
[im 10/15]
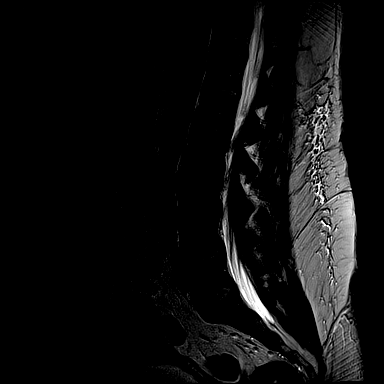
[im 12/15]
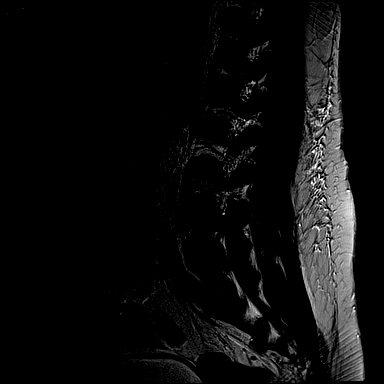
[im 15/15]
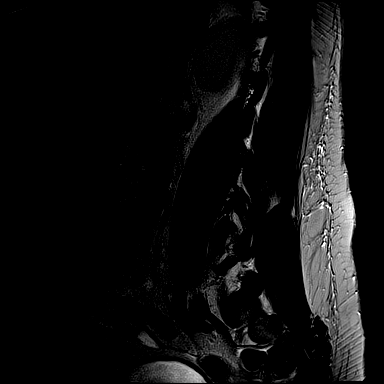

[Series 4: T1 · sagittal · 4.0mm · 0.39mm/px · 3 of 15 slices shown (1 of 2)]
[im 3/15]
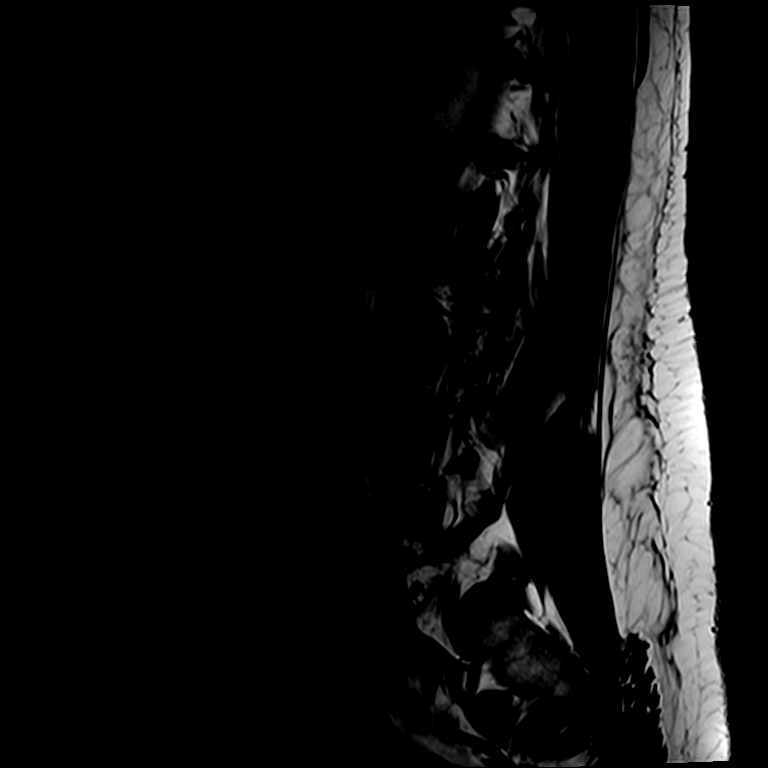
[im 8/15]
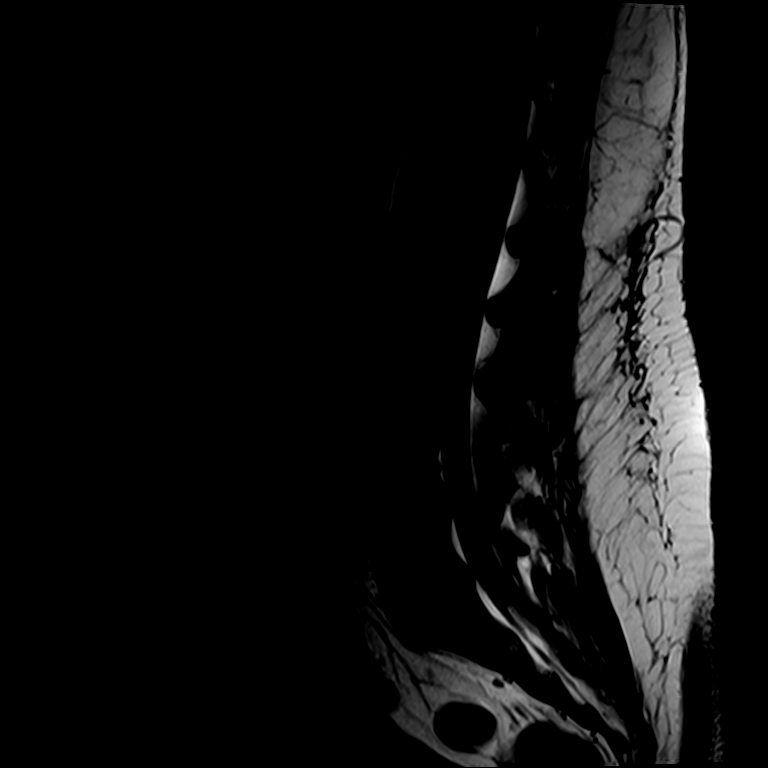
[im 12/15]
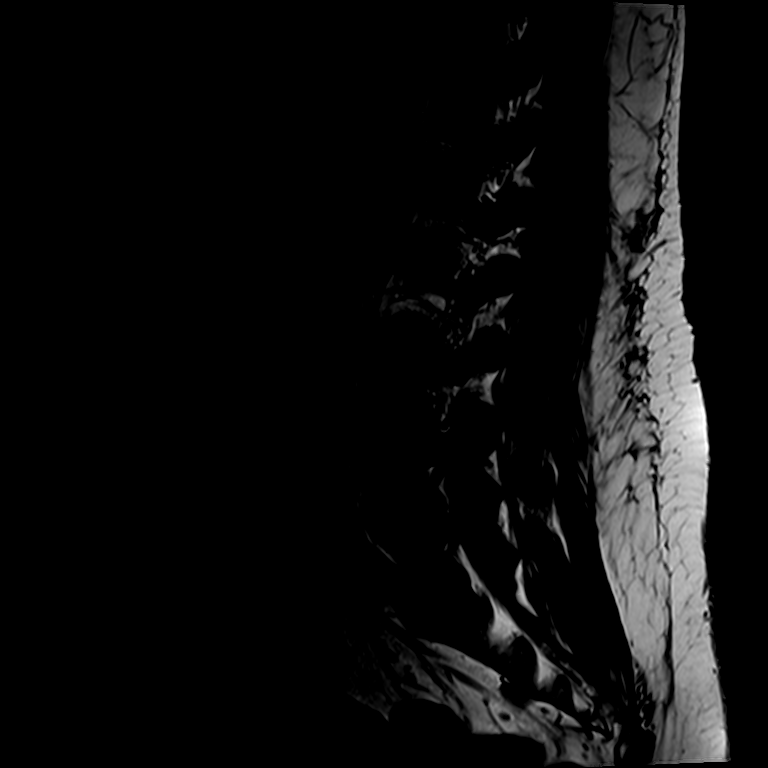

[Series 6: T2 · axial · 4.0mm · 0.23mm/px · z∈[-27,+91]mm · 3 of 34 slices shown (2 of 2)]
[im 6/34]
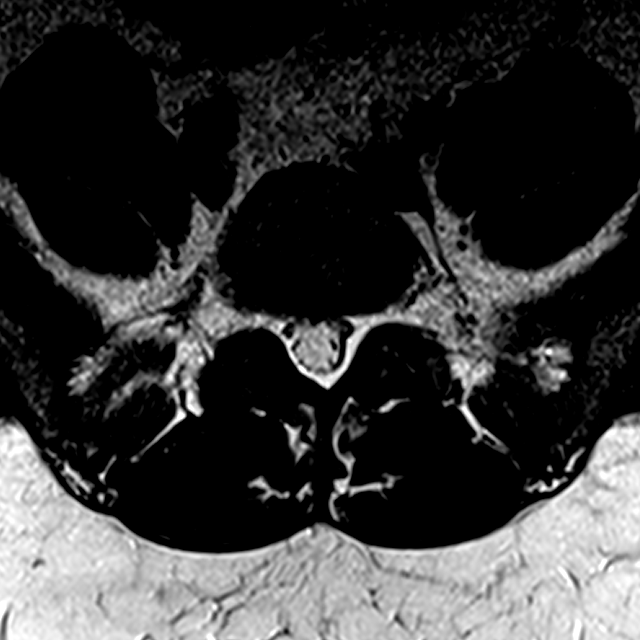
[im 18/34]
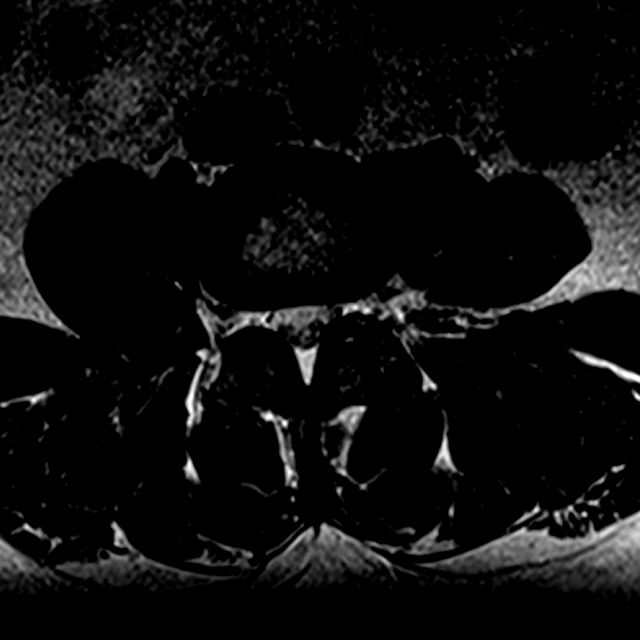
[im 28/34]
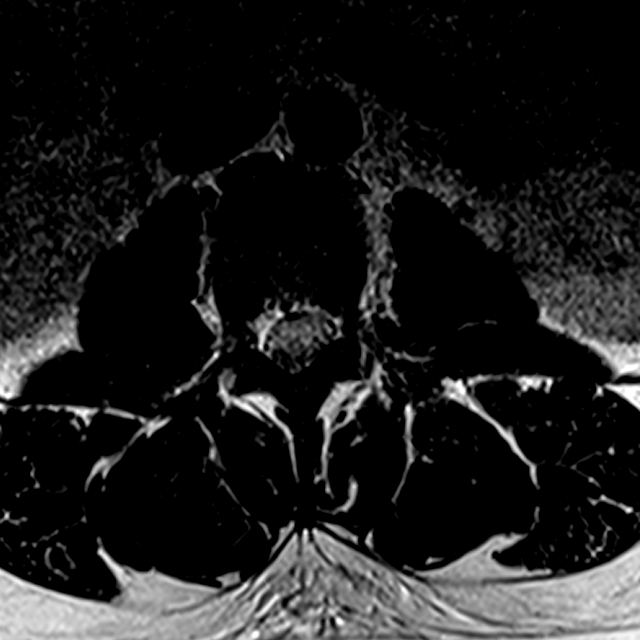

[Series 7: T1 · axial · 4.0mm · 0.23mm/px · z∈[-27,+91]mm · 3 of 34 slices shown (2 of 2)]
[im 6/34]
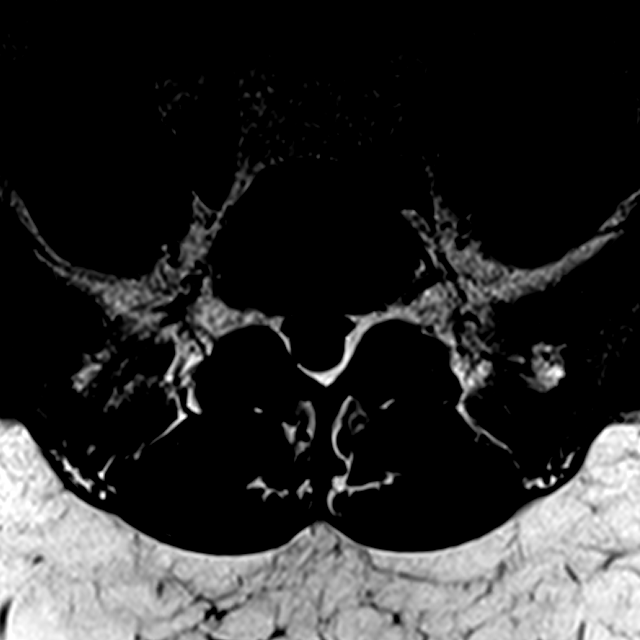
[im 18/34]
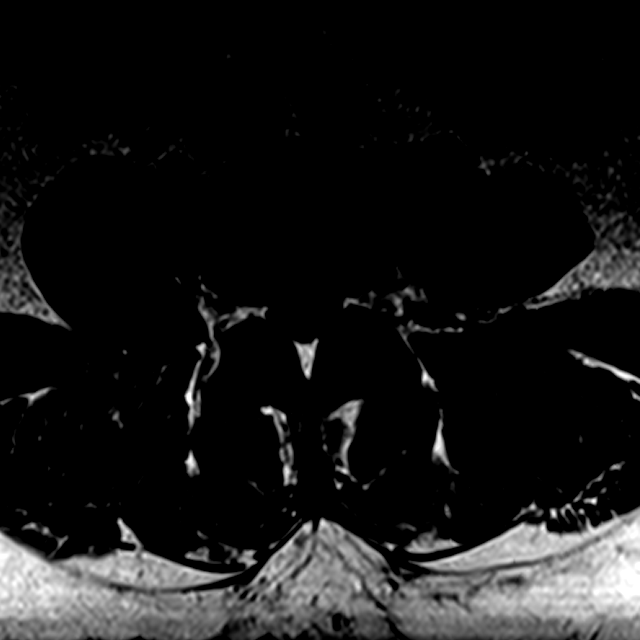
[im 28/34]
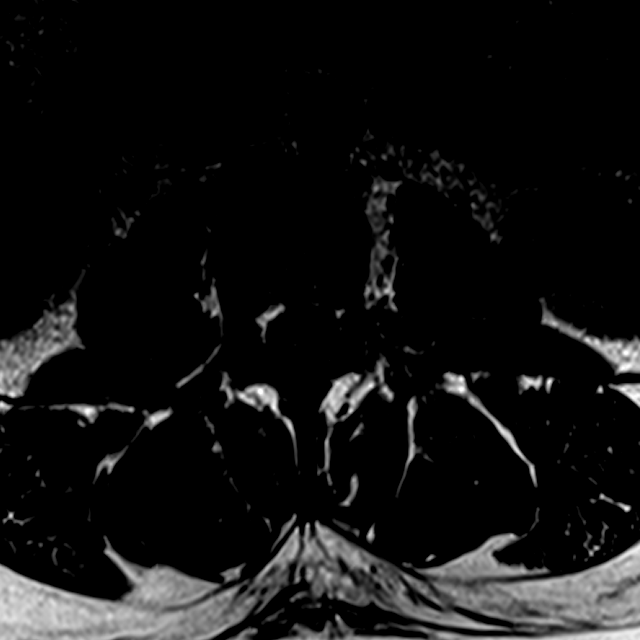

[16 of 48 positions shown; findings below may reference images not displayed]

FINDINGS: Segmentation:  Standard.

Alignment:  Physiologic.

Vertebrae:  No fracture, evidence of discitis, or bone lesion.

Conus medullaris: Extends to the T12 level and appears normal.

Paraspinal and other soft tissues: Negative.

Disc levels:

Disc spaces: Disc spaces are preserved.

T12-L1: No significant disc bulge. No evidence of neural foraminal
stenosis. No central canal stenosis.

L1-L2: No significant disc bulge. No evidence of neural foraminal
stenosis. No central canal stenosis.

L2-L3: No significant disc bulge. No evidence of neural foraminal
stenosis. No central canal stenosis.

L3-L4: No significant disc bulge. Mild bilateral facet arthropathy.
No evidence of neural foraminal stenosis. No central canal stenosis.

L4-L5: No significant disc bulge. No evidence of neural foraminal
stenosis. No central canal stenosis. Mild bilateral facet
arthropathy.

L5-S1: No significant disc bulge. No evidence of neural foraminal
stenosis. No central canal stenosis.
IMPRESSION: 1. Mild bilateral facet arthropathy L3-4 and L4-5.
2. No significant disc protrusion, foraminal stenosis or central
canal stenosis.

## 2017-08-23 ENCOUNTER — Encounter: Payer: Self-pay | Admitting: Family

## 2017-08-23 ENCOUNTER — Ambulatory Visit (INDEPENDENT_AMBULATORY_CARE_PROVIDER_SITE_OTHER): Payer: Commercial Managed Care - PPO | Admitting: Family

## 2017-08-23 VITALS — BP 138/85 | HR 88 | Temp 98.8°F | Ht 65.0 in | Wt 233.6 lb

## 2017-08-23 DIAGNOSIS — Z0001 Encounter for general adult medical examination with abnormal findings: Secondary | ICD-10-CM | POA: Diagnosis not present

## 2017-08-23 DIAGNOSIS — R030 Elevated blood-pressure reading, without diagnosis of hypertension: Secondary | ICD-10-CM

## 2017-08-23 DIAGNOSIS — R400 Somnolence: Secondary | ICD-10-CM | POA: Diagnosis not present

## 2017-08-23 DIAGNOSIS — R0683 Snoring: Secondary | ICD-10-CM

## 2017-08-23 DIAGNOSIS — Z23 Encounter for immunization: Secondary | ICD-10-CM | POA: Diagnosis not present

## 2017-08-23 NOTE — Patient Instructions (Signed)

## 2017-08-23 NOTE — Progress Notes (Signed)
Subjective:    Patient ID: Gina Mccullough, female    DOB: [birth year], 42 y.o.   MRN: [id number]  Chief Complaint  Patient presents with  . New Patient (Initial Visit)  . Hypertension  . Sleep Apnea    HPI Pt presents to the office today to establish care. 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 Today in clinic her BP is normal. She states she was very anxious at work.   Pt reports she snores and has day time tiredness. She states her job want her to have a sleep study.     Review of Systems  All other systems reviewed and are negative.  Family History  Problem Relation Age of Onset  . Heart disease Mother   . Stroke Mother   . Diabetes Brother    Social History   Socioeconomic History  . Marital status: Married    Spouse name: Not on file  . Number of children: Not on file  . Years of education: Not on file  . Highest education level: Not on file  Occupational History  . Not on file  Social Needs  . Financial resource strain: Not on file  . Food insecurity:    Worry: Not on file    Inability: Not on file  . Transportation needs:    Medical: Not on file    Non-medical: Not on file  Tobacco Use  . Smoking status: Never Smoker  . Smokeless tobacco: Never Used  Substance and Sexual Activity  . Alcohol use: No  . Drug use: No  . Sexual activity: Yes    Birth control/protection: Surgical    Comment: ESSURE  Lifestyle  . Physical activity:    Days per week: Not on file    Minutes per session: Not on file  . Stress: Not on file  Relationships  . Social connections:    Talks on phone: Not on file    Gets together: Not on file    Attends religious service: Not on file    Active member of club or organization: Not on file    Attends meetings of clubs or organizations: Not on file    Relationship status: Not on file  . Intimate partner violence:    Fear of current or ex partner: Not on file    Emotionally abused: Not on file   Physically abused: Not on file    Forced sexual activity: Not on file  Other Topics Concern  . Not on file  Social History Narrative  . Not on file       Objective:   Physical Exam  Constitutional: She is oriented to person, place, and time. She appears well-developed and well-nourished. No distress.  HENT:  Head: Normocephalic and atraumatic.  Right Ear: External ear normal.  Left Ear: External ear normal.  Mouth/Throat: Oropharynx is clear and moist.  Eyes: Pupils are equal, round, and reactive to light.  Neck: Normal range of motion. Neck supple. No thyromegaly present.  Cardiovascular: Normal rate, regular rhythm, normal heart sounds and intact distal pulses.  No murmur heard. Pulmonary/Chest: Effort normal and breath sounds normal. No respiratory distress. She has no wheezes.  Abdominal: Soft. Bowel sounds are normal. She exhibits no distension. There is no tenderness.  Musculoskeletal: Normal range of motion. She exhibits no edema or tenderness.  Neurological: She is alert and oriented to person, place, and time. She has normal reflexes. No cranial nerve deficit.  Skin: Skin  is warm and dry.  Psychiatric: She has a normal mood and affect. Her behavior is normal. Judgment and thought content normal.  Vitals reviewed.     BP 138/85   Pulse 88   Temp 98.8 F (37.1 C) (Oral)   Ht 5\' 5"  (1.651 m)   Wt 233 lb 9.6 oz (106 kg)   BMI 38.87 kg/m      Assessment & Plan:  Gina Bruckneradia Casso comes in today with chief complaint of New Patient (Initial Visit); Hypertension; and Sleep Apnea   Diagnosis and orders addressed:  1. Elevated blood pressure reading BP normals today  2. Snores Will do referral to sleep study - Ambulatory referral to Pulmonology  3. Daytime sleepiness - Ambulatory referral to Pulmonology   Labs pending Health Maintenance reviewed- TDAP given  Diet and exercise encouraged  Follow up plan: 1 year    Jannifer Rodneyhristy Olivier Frayre, FNP

## 2017-08-23 NOTE — Addendum Note (Signed)
Addended by: Almeta MonasSTONE, JANIE M on: 08/23/2017 10:24 AM   Modules accepted: Orders

## 2017-08-28 ENCOUNTER — Other Ambulatory Visit: Payer: Self-pay

## 2017-08-28 DIAGNOSIS — R0683 Snoring: Secondary | ICD-10-CM

## 2017-08-28 NOTE — Progress Notes (Unsigned)
Split s

## 2017-09-13 ENCOUNTER — Telehealth: Payer: Self-pay | Admitting: Family

## 2017-09-20 ENCOUNTER — Ambulatory Visit: Payer: Commercial Managed Care - PPO | Attending: Family | Admitting: Neurology

## 2017-09-20 DIAGNOSIS — R0683 Snoring: Secondary | ICD-10-CM

## 2017-09-20 DIAGNOSIS — G4733 Obstructive sleep apnea (adult) (pediatric): Secondary | ICD-10-CM | POA: Diagnosis not present

## 2017-10-05 NOTE — Procedures (Signed)
  [place] A. Merlene Laughter, MD     [email or web address]             NOCTURNAL POLYSOMNOGRAPHY   LOCATION: [facility]  Patient Name: Gina Mccullough, Gina Mccullough Date: 09/20/2017 Gender: Female D.O.B: 1975-10-11 Age (years): 32 Referring Provider: Sharion Balloon FNP Height (inches): 49 Interpreting Physician: Phillips Odor MD, ABSM Weight (lbs): 233 RPSGT: Peak, Robert BMI: 39 MRN: [id number] Neck Size: 17.00 CLINICAL INFORMATION Sleep Study Type: Split Night CPAP  Indication for sleep study: Snoring  Epworth Sleepiness Score: 22  SLEEP STUDY TECHNIQUE As per the AASM Manual for the Scoring of Sleep and Associated Events v2.3 (April 2016) with a hypopnea requiring 4% desaturations.  The channels recorded and monitored were frontal, central and occipital EEG, electrooculogram (EOG), submentalis EMG (chin), nasal and oral airflow, thoracic and abdominal wall motion, anterior tibialis EMG, snore microphone, electrocardiogram, and pulse oximetry. Continuous positive airway pressure (CPAP) was initiated when the patient met split night criteria and was titrated according to treat sleep-disordered breathing.  MEDICATIONS Medications self-administered by patient taken the night of the study : N/A  Current Outpatient Medications:  .  Cholecalciferol 5000 units capsule, Take 1 capsule (5,000 Units total) by mouth daily., Disp: , Rfl:  .  imiquimod (ALDARA) 5 % cream, Apply topically 3 (three) times a week., Disp: 12 each, Rfl: 1 .  IRON PO, Take by mouth., Disp: , Rfl:  .  vitamin B-12 (CYANOCOBALAMIN) 100 MCG tablet, Take 100 mcg by mouth daily., Disp: , Rfl:    RESPIRATORY PARAMETERS Diagnostic  Total AHI (/hr): 75.8 RDI (/hr): 88.8 OA Index (/hr): 33.5 CA Index (/hr): 0.4 REM AHI (/hr): 92.1 NREM AHI (/hr): 72.7 Supine AHI (/hr): 70.8 Non-supine AHI (/hr): 78.8 Min O2 Sat (%): 58.0 Mean O2 (%): 90.4 Time below 88% (min): 44.3   Titration  Optimal Pressure  (cm): 8 AHI at Optimal Pressure (/hr): 0.0 Min O2 at Optimal Pressure (%): 95.0 Supine % at Optimal (%): 0 Sleep % at Optimal (%): 5   SLEEP ARCHITECTURE The recording time for the entire night was 443.2 minutes.  During a baseline period of 168.4 minutes, the patient slept for 134.5 minutes in REM and nonREM, yielding a sleep efficiency of 79.9%%. Sleep onset after lights out was 2.3 minutes with a REM latency of 77.5 minutes. The patient spent 19.0%% of the night in stage N1 sleep, 65.1%% in stage N2 sleep, 0.0%% in stage N3 and 16% in REM.  During the titration period of 264.8 minutes, the patient slept for 45.5 minutes in REM and nonREM, yielding a sleep efficiency of 17.2%%. Sleep onset after CPAP initiation was 23.3 minutes with a REM latency of N/A minutes. The patient spent 26.4%% of the night in stage N1 sleep, 68.1%% in stage N2 sleep, 5.5%% in stage N3 and 0% in REM.  CARDIAC DATA The 2 lead EKG demonstrated sinus rhythm. The mean heart rate was 100.0 beats per minute. Other EKG findings include: None. LEG MOVEMENT DATA The total Periodic Limb Movements of Sleep (PLMS) were 0. The PLMS index was 0.0.  IMPRESSIONS Severe obstructive sleep apnea occurred during the diagnostic portion of the study (AHI = 75.8/hour). The optimal CPAP selected for this patient is ( 8 cm of water)   Delano Metz, MD Diplomate, American Board of Sleep Medicine.

## 2017-10-23 ENCOUNTER — Telehealth: Payer: Self-pay | Admitting: Family

## 2017-10-23 NOTE — Telephone Encounter (Signed)
Please review and advise.

## 2017-10-23 NOTE — Telephone Encounter (Signed)
Pt needs to call Dr. Ronal Fearoonquah's office. They should have written her rx?

## 2017-10-24 NOTE — Addendum Note (Signed)
Addended by: Margurite AuerbachOMPTON, KARLA G on: 10/24/2017 08:49 AM   Modules accepted: Orders

## 2017-10-24 NOTE — Telephone Encounter (Signed)
Pt aware she has sleep apnea and needs a CPAP machine and that we will write rx to send to [facility] who will get in touch with pt.

## 2017-10-24 NOTE — Addendum Note (Signed)
Addended by: Margurite AuerbachOMPTON, Kaileigh Viswanathan G on: 10/24/2017 08:19 AM   Modules accepted: Orders

## 2017-10-31 ENCOUNTER — Telehealth: Payer: Self-pay | Admitting: Family

## 2017-10-31 NOTE — Telephone Encounter (Signed)
Pt aware faxed on 10/25/17 to different department of [facility] Refaxed to [phone] Attn: Respiratory

## 2018-03-20 ENCOUNTER — Encounter: Payer: Commercial Managed Care - PPO | Admitting: Family

## 2018-04-18 ENCOUNTER — Encounter: Payer: Self-pay | Admitting: Family

## 2018-04-18 ENCOUNTER — Ambulatory Visit (INDEPENDENT_AMBULATORY_CARE_PROVIDER_SITE_OTHER): Payer: Commercial Managed Care - PPO | Admitting: Family

## 2018-04-18 VITALS — BP 125/80 | HR 110 | Temp 98.6°F | Ht 65.0 in | Wt 240.0 lb

## 2018-04-18 DIAGNOSIS — N898 Other specified noninflammatory disorders of vagina: Secondary | ICD-10-CM | POA: Diagnosis not present

## 2018-04-18 DIAGNOSIS — E559 Vitamin D deficiency, unspecified: Secondary | ICD-10-CM

## 2018-04-18 DIAGNOSIS — Z0001 Encounter for general adult medical examination with abnormal findings: Secondary | ICD-10-CM

## 2018-04-18 DIAGNOSIS — Z01411 Encounter for gynecological examination (general) (routine) with abnormal findings: Secondary | ICD-10-CM

## 2018-04-18 DIAGNOSIS — Z0000 Encounter for general adult medical examination without abnormal findings: Secondary | ICD-10-CM | POA: Diagnosis not present

## 2018-04-18 DIAGNOSIS — Z01419 Encounter for gynecological examination (general) (routine) without abnormal findings: Secondary | ICD-10-CM | POA: Diagnosis not present

## 2018-04-18 LAB — WET PREP FOR TRICH, YEAST, CLUE
Clue Cell Exam: NEGATIVE
Trichomonas Exam: NEGATIVE
YEAST EXAM: NEGATIVE

## 2018-04-18 NOTE — Progress Notes (Signed)
Subjective:    Patient ID: Jari Sportsman, female    DOB: 1975-11-19, 43 y.o.   MRN: [id number]  Chief Complaint  Patient presents with  . gyn and pap   PT presents to the office today for CPE with pap.  Gynecologic Exam  The patient's primary symptoms include a genital odor (after sex). The patient's pertinent negatives include no genital itching, genital rash, vaginal bleeding or vaginal discharge. The problem occurs intermittently. The patient is experiencing no pain. Pertinent negatives include no back pain, chills, constipation, flank pain, frequency, headaches, hematuria, joint pain, nausea, painful intercourse or vomiting. She is sexually active.      Review of Systems  Constitutional: Negative for chills.  Gastrointestinal: Negative for constipation, nausea and vomiting.  Genitourinary: Negative for flank pain, frequency, hematuria and vaginal discharge.  Musculoskeletal: Negative for back pain and joint pain.  Neurological: Negative for headaches.  All other systems reviewed and are negative.      Objective:   Physical Exam Vitals signs reviewed.  Constitutional:      General: She is not in acute distress.    Appearance: She is well-developed.  HENT:     Head: Normocephalic and atraumatic.     Right Ear: Tympanic membrane normal.     Left Ear: Tympanic membrane normal.  Eyes:     Pupils: Pupils are equal, round, and reactive to light.  Neck:     Musculoskeletal: Normal range of motion and neck supple.     Thyroid: No thyromegaly.  Cardiovascular:     Rate and Rhythm: Normal rate and regular rhythm.     Heart sounds: Normal heart sounds. No murmur.  Pulmonary:     Effort: Pulmonary effort is normal. No respiratory distress.     Breath sounds: Normal breath sounds. No wheezing.  Chest:     Breasts:        Right: No swelling, bleeding, inverted nipple, mass, nipple discharge, skin change or tenderness.        Left: No swelling, bleeding, inverted nipple, mass,  nipple discharge, skin change or tenderness.  Abdominal:     General: Bowel sounds are normal. There is no distension.     Palpations: Abdomen is soft.     Tenderness: There is no abdominal tenderness.  Genitourinary:    General: Normal vulva.     Comments: Bimanual exam- no adnexal masses or tenderness, ovaries nonpalpable   Cervix parous and pink- No discharge  Musculoskeletal: Normal range of motion.        General: No tenderness.  Skin:    General: Skin is warm and dry.  Neurological:     Mental Status: She is alert and oriented to person, place, and time.     Cranial Nerves: No cranial nerve deficit.     Deep Tendon Reflexes: Reflexes are normal and symmetric.  Psychiatric:        Behavior: Behavior normal.        Thought Content: Thought content normal.        Judgment: Judgment normal.       BP 125/80   Pulse (!) 110   Temp 98.6 F (37 C) (Oral)   Ht 5' 5"  (1.651 m)   Wt 240 lb (108.9 kg)   LMP 04/04/2018 (Approximate)   BMI 39.94 kg/m      Assessment & Plan:  Zyanna Leisinger comes in today with chief complaint of gyn and pap   Diagnosis and orders addressed:  1. Annual physical exam -  CMP14+EGFR - CBC with Differential/Platelet - Lipid panel - TSH - Chlamydia/Gonococcus/Trichomonas, NAA - STD Screen (8) - WET PREP FOR TRICH, YEAST, CLUE  2. Vitamin D deficiency - CMP14+EGFR - CBC with Differential/Platelet - VITAMIN D 25 Hydroxy (Vit-D Deficiency, Fractures)  3. Encounter for gynecological examination without abnormal finding - CMP14+EGFR - CBC with Differential/Platelet  4. Vaginal discharge - CMP14+EGFR - CBC with Differential/Platelet - STD Screen (8) - WET PREP FOR [place], YEAST, CLUE   Labs pending Health Maintenance reviewed Diet and exercise encouraged  Follow up plan: 1 year     Evelina Dun, FNP

## 2018-04-18 NOTE — Patient Instructions (Signed)
Vaginitis  Vaginitis is a condition in which the vaginal tissue swells and becomes red (inflamed). This condition is most often caused by a change in the normal balance of bacteria and yeast that live in the vagina. This change causes an overgrowth of certain bacteria or yeast, which causes the inflammation. There are different types of vaginitis, but the most common types are:   Bacterial vaginosis.   Yeast infection (candidiasis).   Trichomoniasis vaginitis. This is a sexually transmitted disease (STD).   Viral vaginitis.   Atrophic vaginitis.   Allergic vaginitis.  What are the causes?  The cause of this condition depends on the type of vaginitis. It can be caused by:   Bacteria (bacterial vaginosis).   Yeast, which is a fungus (yeast infection).   A parasite (trichomoniasis vaginitis).   A virus (viral vaginitis).   Low hormone levels (atrophic vaginitis). Low hormone levels can occur during pregnancy, breastfeeding, or after menopause.   Irritants, such as bubble baths, scented tampons, and feminine sprays (allergic vaginitis).  Other factors can change the normal balance of the yeast and bacteria that live in the vagina. These include:   Antibiotic medicines.   Poor hygiene.   Diaphragms, vaginal sponges, spermicides, birth control pills, and intrauterine devices (IUD).   Sex.   Infection.   Uncontrolled diabetes.   A weakened defense (immune) system.  What increases the risk?  This condition is more likely to develop in women who:   Smoke.   Use vaginal douches, scented tampons, or scented sanitary pads.   Wear tight-fitting pants.   Wear thong underwear.   Use oral birth control pills or an IUD.   Have sex without a condom.   Have multiple sex partners.   Have an STD.   Frequently use the spermicide nonoxynol-9.   Eat lots of foods high in sugar.   Have uncontrolled diabetes.   Have low estrogen levels.   Have a weakened immune system from an immune disorder or medical  treatment.   Are pregnant or breastfeeding.  What are the signs or symptoms?  Symptoms vary depending on the cause of the vaginitis. Common symptoms include:   Abnormal vaginal discharge.  ? The discharge is white, gray, or yellow with bacterial vaginosis.  ? The discharge is thick, white, and cheesy with a yeast infection.  ? The discharge is frothy and yellow or greenish with trichomoniasis.   A bad vaginal smell. The smell is fishy with bacterial vaginosis.   Vaginal itching, pain, or swelling.   Sex that is painful.   Pain or burning when urinating.  Sometimes there are no symptoms.  How is this diagnosed?  This condition is diagnosed based on your symptoms and medical history. A physical exam, including a pelvic exam, will also be done. You may also have other tests, including:   Tests to determine the pH level (acidity or alkalinity) of your vagina.   A whiff test, to assess the odor that results when a sample of your vaginal discharge is mixed with a potassium hydroxide solution.   Tests of vaginal fluid. A sample will be examined under a microscope.  How is this treated?  Treatment varies depending on the type of vaginitis you have. Your treatment may include:   Antibiotic creams or pills to treat bacterial vaginosis and trichomoniasis.   Antifungal medicines, such as vaginal creams or suppositories, to treat a yeast infection.   Medicine to ease discomfort if you have viral vaginitis. Your sexual partner   should also be treated.   Estrogen delivered in a cream, pill, suppository, or vaginal ring to treat atrophic vaginitis. If vaginal dryness occurs, lubricants and moisturizing creams may help. You may need to avoid scented soaps, sprays, or douches.   Stopping use of a product that is causing allergic vaginitis. Then using a vaginal cream to treat the symptoms.  Follow these instructions at home:  Lifestyle   Keep your genital area clean and dry. Avoid soap, and only rinse the area with  water.   Do not douche or use tampons until your health care provider says it is okay to do so. Use sanitary pads, if needed.   Do not have sex until your health care provider approves. When you can return to sex, practice safe sex and use condoms.   Wipe from front to back. This avoids the spread of bacteria from the rectum to the vagina.  General instructions   Take over-the-counter and prescription medicines only as told by your health care provider.   If you were prescribed an antibiotic medicine, take or use it as told by your health care provider. Do not stop taking or using the antibiotic even if you start to feel better.   Keep all follow-up visits as told by your health care provider. This is important.  How is this prevented?   Use mild, non-scented products. Do not use things that can irritate the vagina, such as fabric softeners. Avoid the following products if they are scented:  ? Feminine sprays.  ? Detergents.  ? Tampons.  ? Feminine hygiene products.  ? Soaps or bubble baths.   Let air reach your genital area.  ? Wear cotton underwear to reduce moisture buildup.  ? Avoid wearing underwear while you sleep.  ? Avoid wearing tight pants and underwear or nylons without a cotton panel.  ? Avoid wearing thong underwear.   Take off any wet clothing, such as bathing suits, as soon as possible.   Practice safe sex and use condoms.  Contact a health care provider if:   You have abdominal pain.   You have a fever.   You have symptoms that last for more than 2-3 days.  Get help right away if:   You have a fever and your symptoms suddenly get worse.  Summary   Vaginitis is a condition in which the vaginal tissue becomes inflamed.This condition is most often caused by a change in the normal balance of bacteria and yeast that live in the vagina.   Treatment varies depending on the type of vaginitis you have.   Do not douche, use tampons , or have sex until your health care provider approves. When  you can return to sex, practice safe sex and use condoms.  This information is not intended to replace advice given to you by your health care provider. Make sure you discuss any questions you have with your health care provider.  Document Released: 12/10/2006 Document Revised: 03/20/2016 Document Reviewed: 03/20/2016  Elsevier Interactive Patient Education  2019 [employer]

## 2018-04-18 NOTE — Addendum Note (Signed)
Addended by: Almeta Monas on: 04/18/2018 04:05 PM   Modules accepted: Orders

## 2018-04-19 LAB — CBC WITH DIFFERENTIAL/PLATELET
Basophils Absolute: 0 10*3/uL (ref 0.0–0.2)
Basos: 0 %
EOS (ABSOLUTE): 0.1 10*3/uL (ref 0.0–0.4)
EOS: 2 %
HEMATOCRIT: 39.7 % (ref 34.0–46.6)
Hemoglobin: 13 g/dL (ref 11.1–15.9)
IMMATURE GRANULOCYTES: 0 %
Immature Grans (Abs): 0 10*3/uL (ref 0.0–0.1)
LYMPHS: 30 %
Lymphocytes Absolute: 2 10*3/uL (ref 0.7–3.1)
MCH: 27.7 pg (ref 26.6–33.0)
MCHC: 32.7 g/dL (ref 31.5–35.7)
MCV: 85 fL (ref 79–97)
MONOS ABS: 0.5 10*3/uL (ref 0.1–0.9)
Monocytes: 8 %
NEUTROS PCT: 60 %
Neutrophils Absolute: 4 10*3/uL (ref 1.4–7.0)
PLATELETS: 399 10*3/uL (ref 150–450)
RBC: 4.7 x10E6/uL (ref 3.77–5.28)
RDW: 12.8 % (ref 11.7–15.4)
WBC: 6.8 10*3/uL (ref 3.4–10.8)

## 2018-04-19 LAB — CMP14+EGFR
ALK PHOS: 102 IU/L (ref 39–117)
ALT: 17 IU/L (ref 0–32)
AST: 15 IU/L (ref 0–40)
Albumin/Globulin Ratio: 1.2 (ref 1.2–2.2)
Albumin: 4.1 g/dL (ref 3.8–4.8)
BUN/Creatinine Ratio: 18 (ref 9–23)
BUN: 15 mg/dL (ref 6–24)
Bilirubin Total: 0.4 mg/dL (ref 0.0–1.2)
CO2: 20 mmol/L (ref 20–29)
Calcium: 9.5 mg/dL (ref 8.7–10.2)
Chloride: 102 mmol/L (ref 96–106)
Creatinine, Ser: 0.83 mg/dL (ref 0.57–1.00)
GFR calc Af Amer: 101 mL/min/{1.73_m2} (ref >59)
GFR calc non Af Amer: 88 mL/min/{1.73_m2} (ref >59)
GLOBULIN, TOTAL: 3.3 g/dL (ref 1.5–4.5)
GLUCOSE: 144 mg/dL — AB (ref 65–99)
Potassium: 4.3 mmol/L (ref 3.5–5.2)
SODIUM: 138 mmol/L (ref 134–144)
Total Protein: 7.4 g/dL (ref 6.0–8.5)

## 2018-04-19 LAB — STD SCREEN (8)
HEP B C IGM: NEGATIVE
HIV Screen 4th Generation wRfx: NONREACTIVE
HSV 1 GLYCOPROTEIN G AB, IGG: 36.6 {index} — AB (ref 0.00–0.90)
HSV 2 IgG, Type Spec: <0.91 index (ref 0.00–0.90)
Hep A IgM: NEGATIVE
Hep C Virus Ab: <0.1 s/co ratio (ref 0.0–0.9)
Hepatitis B Surface Ag: NEGATIVE
RPR Ser Ql: NONREACTIVE

## 2018-04-19 LAB — LIPID PANEL
Chol/HDL Ratio: 4.4 ratio (ref 0.0–4.4)
Cholesterol, Total: 158 mg/dL (ref 100–199)
HDL: 36 mg/dL — AB (ref >39)
LDL Calculated: 91 mg/dL (ref 0–99)
TRIGLYCERIDES: 157 mg/dL — AB (ref 0–149)
VLDL Cholesterol Cal: 31 mg/dL (ref 5–40)

## 2018-04-19 LAB — TSH: TSH: 1.74 u[IU]/mL (ref 0.450–4.500)

## 2018-04-19 LAB — VITAMIN D 25 HYDROXY (VIT D DEFICIENCY, FRACTURES): Vit D, 25-Hydroxy: 37.4 ng/mL (ref 30.0–100.0)

## 2018-04-20 LAB — CHLAMYDIA/GONOCOCCUS/TRICHOMONAS, NAA
Chlamydia by NAA: NEGATIVE
GONOCOCCUS BY NAA: NEGATIVE
Trich vag by NAA: NEGATIVE

## 2018-04-30 LAB — PAP IG, CT-NG, RFX HPV ASCU
CHLAMYDIA, NUC. ACID AMP: NEGATIVE
GONOCOCCUS BY NUCLEIC ACID AMP: NEGATIVE

## 2018-04-30 LAB — HPV DNA PROBE HIGH RISK, AMPLIFIED: HPV, HIGH-RISK: NEGATIVE

## 2018-11-18 ENCOUNTER — Other Ambulatory Visit: Payer: Self-pay | Admitting: Family

## 2018-11-18 NOTE — Telephone Encounter (Signed)
What is the name of the medication? cpap machine  Have you contacted your pharmacy to request a refill? no  Which pharmacy would you like this sent to? [facility] in [place]   Patient notified that their request is being sent to the clinical staff for review and that they should receive a call once it is complete. If they do not receive a call within 24 hours they can check with their pharmacy or our office.

## 2018-11-19 NOTE — Telephone Encounter (Signed)
Patient aware and Number given to Dr. Freddie Apley office

## 2018-11-19 NOTE — Telephone Encounter (Signed)
She would need to call Dr. Freddie Apley office as they are the one who did the sleep study.

## 2018-12-01 ENCOUNTER — Ambulatory Visit: Payer: Commercial Managed Care - PPO | Admitting: Family

## 2020-04-29 ENCOUNTER — Other Ambulatory Visit: Payer: Self-pay

## 2020-04-29 ENCOUNTER — Encounter: Payer: Self-pay | Admitting: Orthopedic Surgery

## 2020-04-29 ENCOUNTER — Ambulatory Visit: Payer: Commercial Managed Care - PPO

## 2020-04-29 ENCOUNTER — Ambulatory Visit (INDEPENDENT_AMBULATORY_CARE_PROVIDER_SITE_OTHER): Payer: Commercial Managed Care - PPO | Admitting: Orthopedic Surgery

## 2020-04-29 VITALS — BP 169/100 | HR 92 | Ht 63.0 in | Wt 241.0 lb

## 2020-04-29 DIAGNOSIS — G8929 Other chronic pain: Secondary | ICD-10-CM

## 2020-04-29 DIAGNOSIS — M1711 Unilateral primary osteoarthritis, right knee: Secondary | ICD-10-CM | POA: Diagnosis not present

## 2020-04-29 DIAGNOSIS — Z6841 Body Mass Index (BMI) 40.0 and over, adult: Secondary | ICD-10-CM

## 2020-04-29 DIAGNOSIS — M25561 Pain in right knee: Secondary | ICD-10-CM

## 2020-04-29 NOTE — Patient Instructions (Signed)

## 2020-04-29 NOTE — Progress Notes (Signed)
New Patient Visit  Assessment: Gina Mccullough is a 45 y.o. female with the following: Chronic pain of right knee  Plan: [name] has right knee pain that appears to be brought on by patellar instability.  Based on her history, she has likely had some patella subluxations.  She has never required a formal reduction.  Nonetheless, I do feel that this is why she is having some difficulty in her right knee.  She has previously had injections, which have improved her symptoms.  As a result, she would like to proceed with another injection.  In addition, she should continue to use the current brace that she has.  If she wishes to try another one, we can recommend a patella stabilizing brace.  I have also provided her with a home exercise program, specifically to strengthen the right quadriceps.  If she has any issues in the future, she can contact the clinic for follow-up appointment.  Procedure note injection - Right Knee joint   Verbal consent was obtained to inject the Right Knee joint  Timeout was completed to confirm the site of injection.  The skin was prepped with alcohol and ethyl chloride was sprayed at the injection site.  A 21-gauge needle was used to inject 6 mg of Betamethasone and 1% lidocaine (3 cc) into the Right Knee using an Anterolateral approach.  There were no complications. A sterile bandage was applied.   The patient meets the AMA guidelines for Morbid obesity with BMI > 40.  The patient has been counseled on weight loss.     Follow-up: Return if symptoms worsen or fail to improve.  Subjective:  Chief Complaint  Patient presents with  . Knee Pain    Right knee, Patient reports been hurting since she was 14 and worse in the last few weeks.     History of Present Illness: Gina Mccullough is a 45 y.o. female who presents for evaluation of right knee pain.  She states she has had intermittent right knee pain for many years.  The first time she had pain was when she was 45  years old.  She describes a "shift" in her knee, followed by pain.  She is not had a frank dislocation of her right patella.  She has never had to have her knee reduced.  However, she notes flares of this shifting sensation with this does cause pain.  She is previously had injections in her knee, which significantly improved.  Recently, she has had to walk with the assistance of a brace.  She notes worsening symptoms if she is not wearing a brace and Ace wrap.  She is not taking medications on a consistent basis.  No recent injections.  She states her pain is not that severe right now.   Review of Systems: No fevers or chills No numbness or tingling No chest pain No shortness of breath No bowel or bladder dysfunction No GI distress No headaches   Medical History:  Past Medical History:  Diagnosis Date  . Dyslipidemia 11/16/2015  . Vaginal Pap smear, abnormal    hx HPV  . Vitamin D deficiency 11/16/2015  . Warts, genital     Past Surgical History:  Procedure Laterality Date  . CESAREAN SECTION  [id number]  . DILATION AND CURETTAGE OF UTERUS  2009  . ESSURE TUBAL LIGATION  2014    Family History  Problem Relation Age of Onset  . Heart disease Mother   . Stroke Mother   . Diabetes Brother  Social History   Tobacco Use  . Smoking status: Never Smoker  . Smokeless tobacco: Never Used  Vaping Use  . Vaping Use: Never used  Substance Use Topics  . Alcohol use: No  . Drug use: No    No Known Allergies  Current Meds  Medication Sig  . acetaminophen (TYLENOL) 500 MG tablet Take 500 mg by mouth every 6 (six) hours as needed.  . Cholecalciferol 5000 units capsule Take 1 capsule (5,000 Units total) by mouth daily.  Marland Kitchen ibuprofen (ADVIL) 200 MG tablet Take 200 mg by mouth every 6 (six) hours as needed.  . IRON PO Take by mouth.  . vitamin B-12 (CYANOCOBALAMIN) 100 MCG tablet Take 100 mcg by mouth daily.    Objective: BP (!) 169/100   Pulse 92   Ht 5\' 3"  (1.6 m)   Wt 241  lb (109.3 kg)   BMI 42.69 kg/m   Physical Exam:  General: Alert and oriented, no acute distress  Gait: Normal  Evaluation of the right knee demonstrates no effusion.  She has full and painless range of motion.  Negative J sign.  No pain with patellar grind test, but crepitus is appreciated.  Negative Lachman.  No increased laxity to varus or valgus stress.  No tenderness along the medial or lateral joint lines.  She does have a Baker's cyst, which not currently tender.  Sensation is intact distally.  Toes are warm and well-perfused.    IMAGING: I personally ordered and reviewed the following images   X-rays of the right knee were obtained in clinic today and demonstrates neutral overall alignment.  There is some loss of joint space within all 3 compartments.  Lateral patellar tilt.  Osteophytes are present within all 3 compartments, most prominently within the medial and patellofemoral compartments.  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   New Medications:  No orders of the defined types were placed in this encounter.     , MD  04/29/2020 1:44 PM

## 2020-05-10 ENCOUNTER — Telehealth: Payer: Self-pay | Admitting: Orthopedic Surgery

## 2020-05-10 NOTE — Telephone Encounter (Signed)
Patient called during lunch.  She said she had previous gel injections and would like to have those again.  She said this was discussed on her last visit.  She understands that we will have to get these injections approved through her insurance company.  She asks that we get approval for her and let her know when she can get injections.  Thanks

## 2020-05-10 NOTE — Telephone Encounter (Signed)
Please advise on the date she can start.

## 2020-05-10 NOTE — Telephone Encounter (Signed)
I called and spoke with the patient and she is aware that we would have to wait 3 months before getting more injections. She reports that she may have new insurance by that time.   She may also want to know how much to pay out of pocket.

## 2020-05-10 NOTE — Telephone Encounter (Signed)
Thanks Francesco Runner  She will have to wait at least 3 months from her most recent injection on April 29, 2020.  As she is aware, we will have to get authorization prior to scheduling this appointment.   If she wants to schedule that visit now, we can work towards authorization.   Thanks [employer]

## 2020-05-13 NOTE — Telephone Encounter (Signed)
This patient reports that she insurance is changing in a couple of months. Should we wait to do the benefits for a visco supplementation? She had an injection on 04/29/20. She will let us know when we get a new insurance card to run visco benefits.

## 2020-05-17 ENCOUNTER — Telehealth: Payer: Self-pay | Admitting: Orthopedic Surgery

## 2020-05-17 NOTE — Telephone Encounter (Signed)
I am ok to get this setup, but we will not be able to proceed with another injection within 3 months.  We have to wait 3 months from the date of her last injection.  Please let me know if you have any additional questions.   Loraine Leriche

## 2020-05-17 NOTE — Telephone Encounter (Signed)
Yes, we need to wait.  I will hold message, just let me know when we need to check benefits. Thanks.

## 2020-05-17 NOTE — Telephone Encounter (Signed)
Can you please call patient and tell her it will need to be 3 months from last injection, and to call us when she has her new coverage?  So we can go ahead and check benefits?  Thanks.

## 2020-05-17 NOTE — Telephone Encounter (Signed)
Patient called this morning asking about her injections.  She said if possible she wants to proceed with the injections.  She would like for [place] to check on this and also see what her out of pocket will be.  She said that her current insurance does not change until July and she doesn't want to wait that long to get the injections.  She works at night and asks if we call and she doesn't answer, please leave her a message  Thanks

## 2020-05-17 NOTE — Telephone Encounter (Signed)
I had talked with the patient and let her know at the last conversation about her injections. She did voice understanding about her injections.

## 2020-05-17 NOTE — Telephone Encounter (Signed)
Patient is requesting to go ahead with HA injections.  Ok to do this?  Or do you want her to wait 3 months per other note?

## 2020-05-20 ENCOUNTER — Other Ambulatory Visit: Payer: Self-pay | Admitting: Orthopedic Surgery

## 2020-05-20 ENCOUNTER — Telehealth: Payer: Self-pay | Admitting: Orthopedic Surgery

## 2020-05-20 DIAGNOSIS — G8929 Other chronic pain: Secondary | ICD-10-CM

## 2020-05-20 DIAGNOSIS — M25561 Pain in right knee: Secondary | ICD-10-CM

## 2020-05-20 NOTE — Progress Notes (Signed)
New orders placed for [facility].

## 2020-05-20 NOTE — Telephone Encounter (Signed)
I called the patient and she reports that she is not able to walk well. She is using the brace and taking OTC Ibuprofen or Aleve. She is aware she is not able to have another injections and wants to know if there are other options. Please advise.

## 2020-05-20 NOTE — Telephone Encounter (Signed)
I have placed an order physical therapy and patient is aware of the referral. No other concerns.

## 2020-05-20 NOTE — Telephone Encounter (Signed)
Patient said that the injection she got a few weeks ago is not helping.  She understands that she cannot get gel injections right now but wants to know what else can be done.  She said her knee is hurting and she cant hardly walk on it.    Can you call and advise the patient?  Thanks

## 2020-05-20 NOTE — Telephone Encounter (Signed)
Thanks  We can provide a prescription for an NSAID, something a little stronger than what she is currently taking.  We can also send her some general exercises for her knee, or recommend PT.   Tanice Petre A. Dallas Schimke, MD MS [facility] [address] [place],  [place]  [address] Phone: [phone] Fax: [phone]

## 2020-06-01 ENCOUNTER — Other Ambulatory Visit: Payer: Self-pay

## 2020-06-01 ENCOUNTER — Ambulatory Visit (HOSPITAL_COMMUNITY): Payer: Commercial Managed Care - PPO | Attending: Orthopedic Surgery | Admitting: Physical Therapy

## 2020-06-01 ENCOUNTER — Encounter (HOSPITAL_COMMUNITY): Payer: Self-pay | Admitting: Physical Therapy

## 2020-06-01 DIAGNOSIS — R2689 Other abnormalities of gait and mobility: Secondary | ICD-10-CM | POA: Insufficient documentation

## 2020-06-01 DIAGNOSIS — R29898 Other symptoms and signs involving the musculoskeletal system: Secondary | ICD-10-CM | POA: Insufficient documentation

## 2020-06-01 DIAGNOSIS — M6281 Muscle weakness (generalized): Secondary | ICD-10-CM | POA: Diagnosis present

## 2020-06-01 DIAGNOSIS — R262 Difficulty in walking, not elsewhere classified: Secondary | ICD-10-CM | POA: Diagnosis present

## 2020-06-01 DIAGNOSIS — M25561 Pain in right knee: Secondary | ICD-10-CM | POA: Diagnosis present

## 2020-06-01 NOTE — Patient Instructions (Signed)
Access Code: [email or web address] URL: [email or web address] Date: 06/01/2020 Prepared by: [facility] Gina Mccullough  Exercises Seated Long Arc Quad - 3 x daily - 7 x weekly - 10 reps - 10 second hold

## 2020-06-01 NOTE — Therapy (Signed)
[facility] [address] [place], [place], [address] Phone: [phone]   Fax:  [phone]  Physical Therapy Evaluation  Patient Details  Name: Gina Mccullough MRN: [id number] Date of Birth: [birth year] Referring Provider (PT): Thane Edu MD   Encounter Date: 06/01/2020   PT End of Session - 06/01/20 0912    Visit Number 1    Number of Visits 12    Date for PT Re-Evaluation 07/13/20    Authorization Type [employer]  (visits 20 - medical review after, fax request to medical necessity request) (code [id number] - self care not included)    Authorization - Visit Number 1    Authorization - Number of Visits 20    Progress Note Due on Visit 10    PT Start Time 0830    PT Stop Time 0909    PT Time Calculation (min) 39 min    Activity Tolerance Patient tolerated treatment well    Behavior During Therapy [facility] for tasks assessed/performed           Past Medical History:  Diagnosis Date  . Dyslipidemia 11/16/2015  . Vaginal Pap smear, abnormal    hx HPV  . Vitamin D deficiency 11/16/2015  . Warts, genital     Past Surgical History:  Procedure Laterality Date  . CESAREAN SECTION  [id number]  . DILATION AND CURETTAGE OF UTERUS  2009  . ESSURE TUBAL LIGATION  2014    There were no vitals filed for this visit.    Subjective Assessment - 06/01/20 0828    Subjective Patient is a 45 y.o. female who presents to physical therapy with c/o chronic R knee pain. Patient states her knee pain has been on and off for years but has been gradually worsening over the last month. She notes increased pain began with a twisting motion about a month ago. She notes increased pain and swelling and pulling and stiffening with walking, laying, working. Swelling and pain in back part of leg noted with working. She has a pretty physical job working with buses. She wears a brace. She has not found anything that helps with pain. She has been taking alive with minimal relief.  Her main goal to get her knee to stop hurting and bone to stop moving. She thinks working and being on her feet aggravates her knee more.    Limitations Lifting;Standing;Sitting;House hold activities;Walking    Patient Stated Goals to get her knee to stop hurting and bone to stop moving    Currently in Pain? No/denies   worst 6/10 after long day             [facility] PT Assessment - 06/01/20 0001      Assessment   Medical Diagnosis Chronic pain of R knee    Referring Provider (PT) Thane Edu MD    Onset Date/Surgical Date 05/01/20    Next MD Visit none scheduled    Prior Therapy none      Precautions   Precautions None      Restrictions   Weight Bearing Restrictions No      Balance Screen   Has the patient fallen in the past 6 months No    Has the patient had a decrease in activity level because of a fear of falling?  No    Is the patient reluctant to leave their home because of a fear of falling?  No      Prior Function   Level of Independence Independent  Vocation Full time employment    [employer] Works on buses      Cognition   Overall Cognitive Status Within Functional Limits for tasks assessed      Observation/Other Assessments   Observations Slow, labored transfers relying on LLE, antalgic gait, brace on R knee    Focus on Therapeutic Outcomes (FOTO)  63% function      ROM / Strength   AROM / PROM / Strength AROM;Strength      AROM   Overall AROM Comments pain in R knee with flex/ext    AROM Assessment Site Knee    Right/Left Knee Right;Left    Right Knee Extension 4   hyperext   Right Knee Flexion 115    Left Knee Extension 6   hyper ext   Left Knee Flexion 123      Strength   Strength Assessment Site Hip;Knee;Ankle    Right/Left Hip Right;Left    Right Hip Flexion 4+/5    Left Hip Flexion 4+/5    Right/Left Knee Right;Left    Right Knee Flexion 4+/5    Right Knee Extension 4-/5   crepitus and pain, difficulty with TKE initially   Left  Knee Flexion 5/5    Left Knee Extension 5/5    Right/Left Ankle Right;Left    Right Ankle Dorsiflexion 5/5    Left Ankle Dorsiflexion 5/5      Palpation   Patella mobility hypermobile tender, catching    Palpation comment TTP RLCL, superior and lateral patella, popliteal fossa, hamstring tendings, medial and lateral joint line; grossly tender throughout knee; No tenderness in quads/calf      Transfers   Five time sit to stand comments  13.66 seconds without UE use      Ambulation/Gait   Ambulation/Gait Yes    Ambulation Distance (Feet) 305 Feet    Assistive device None    Gait Pattern Antalgic;Right flexed knee in stance    Ambulation Surface Level;Indoor    Gait velocity decreased    Stairs Yes    Gait Comments , able to navigate stairs with alternating pattern with heavy UE support, decreased strength and motor control on RLE                      Objective measurements completed on examination: See above findings.       [facility] Adult PT Treatment/Exercise - 06/01/20 0001      Exercises   Exercises Knee/Hip      Knee/Hip Exercises: Seated   Long Arc Quad Right;10 reps    Long Arc Quad Limitations 10 second                  PT Education - 06/01/20 [phone]    Education Details Patient educated on exam findings, POC, scope of PT, HEP    Person(s) Educated Patient    Methods Explanation;Demonstration;Handout    Comprehension Verbalized understanding;Returned demonstration            PT Short Term Goals - 06/01/20 0940      PT SHORT TERM GOAL #1   Title Patient will be independent with HEP in order to improve functional outcomes.    Time 3    Period Weeks    Status New    Target Date 06/22/20      PT SHORT TERM GOAL #2   Title Patient will report at least 25% improvement in symptoms for improved quality of life.    Time 3  Period Weeks    Status New    Target Date 06/22/20             PT Long Term Goals - 06/01/20 0940      PT  LONG TERM GOAL #1   Title Patient will report at least 75% improvement in symptoms for improved quality of life.    Time 6    Period Weeks    Status New    Target Date 07/13/20      PT LONG TERM GOAL #2   Title Patient will improve FOTO score by at least 10 points in order to indicate improved tolerance to activity.    Time 6    Period Weeks    Status New    Target Date 07/13/20      PT LONG TERM GOAL #3   Title Patient will be able to complete 5x STS in under 11.4 seconds in order to demonstrate improving LE strength.    Time 6    Period Weeks    Status New    Target Date 07/13/20      PT LONG TERM GOAL #4   Title Patient will be able to ambulate at least 400 feet in 2MWT in order to demonstrate improved gait speed for community ambulation.    Time 6    Period Weeks    Status New    Target Date 07/13/20                  Plan - 06/01/20 0931    Clinical Impression Statement Patient is a 45 y.o. female who presents to physical therapy with c/o chronic R knee pain. She presents with pain limited deficits in L knee strength, ROM, endurance, gait, stairs, and functional mobility with ADL. She is having to modify and restrict ADL as indicated by FOTO score as well as subjective information and objective measures which is affecting overall participation. Patient will benefit from skilled physical therapy in order to improve function and reduce impairment.    Personal Factors and Comorbidities Comorbidity 1;Age;Fitness;Past/Current Experience;Behavior Pattern;Profession;Time since onset of injury/illness/exacerbation    Comorbidities increased BMI    Examination-Activity Limitations Locomotion Level;Transfers;Bend;Sit;Sleep;Squat;Stairs;Stand;Lift    Examination-Participation Restrictions Cleaning;Occupation;Community Activity;Volunteer;Yard Work;Shop    Stability/Clinical Decision Making Stable/Uncomplicated    Clinical Decision Making Low    Rehab Potential Good    PT  Frequency 2x / week    PT Duration 6 weeks    PT Treatment/Interventions ADLs/Self Care Home Management;Aquatic Therapy;Cryotherapy;Electrical Stimulation;Iontophoresis 4mg /ml Dexamethasone;Moist Heat;Traction;DME Instruction;Gait training;Stair training;Functional mobility training;Therapeutic activities;Therapeutic exercise;Ultrasound;Balance training;Neuromuscular re-education;Patient/family education;Orthotic Fit/Training;Manual techniques;Manual lymph drainage;Compression bandaging;Scar mobilization;Passive range of motion;Dry needling;Energy conservation;Taping;Splinting;Spinal Manipulations;Joint Manipulations    PT Next Visit Plan continue with quad strengthening, hip strengthening, and add gait training/balance    PT Home Exercise Plan 4/6 LAQ    Consulted and Agree with Plan of Care Patient           Patient will benefit from skilled therapeutic intervention in order to improve the following deficits and impairments:  Abnormal gait,Decreased range of motion,Difficulty walking,Pain,Decreased endurance,Increased muscle spasms,Impaired perceived functional ability,Decreased activity tolerance,Decreased balance,Improper body mechanics,Impaired flexibility,Hypermobility,Decreased mobility,Decreased strength  Visit Diagnosis: Right knee pain, unspecified chronicity  Muscle weakness (generalized)  Other abnormalities of gait and mobility  Other symptoms and signs involving the musculoskeletal system     Problem List Patient Active Problem List   Diagnosis Date Noted  . Encounter for gynecological examination with Papanicolaou smear of cervix 04/11/2017  . Elevated hemoglobin A1c 04/11/2017  .  History of genital warts 04/11/2017  . Lumbar radiculopathy   . Impaired glucose tolerance 09/18/2016  . Acute pancreatitis 09/16/2016  . Epigastric abdominal pain   . Vitamin D deficiency 11/16/2015  . Dyslipidemia 11/16/2015  . Warts, genital 11/09/2014    9:44 AM, 06/01/20 Wyman Songster PT, DPT Physical Therapist at [facility]   [facility] [address] [place], [place], [address] Phone: [phone]   Fax:  [phone]  Name: Gina Mccullough MRN: [id number] Date of Birth: [birth year]

## 2020-06-02 ENCOUNTER — Ambulatory Visit (HOSPITAL_COMMUNITY): Payer: Commercial Managed Care - PPO

## 2020-06-02 ENCOUNTER — Encounter (HOSPITAL_COMMUNITY): Payer: Self-pay

## 2020-06-02 DIAGNOSIS — M25561 Pain in right knee: Secondary | ICD-10-CM

## 2020-06-02 DIAGNOSIS — R2689 Other abnormalities of gait and mobility: Secondary | ICD-10-CM

## 2020-06-02 DIAGNOSIS — M6281 Muscle weakness (generalized): Secondary | ICD-10-CM

## 2020-06-02 DIAGNOSIS — R29898 Other symptoms and signs involving the musculoskeletal system: Secondary | ICD-10-CM

## 2020-06-02 NOTE — Therapy (Signed)
[facility] [address] [place], [place], [address] Phone: [phone]   Fax:  [phone]  Physical Therapy Treatment  Patient Details  Name: Gina Mccullough MRN: [id number] Date of Birth: March 25, 1975 Referring Provider (PT): Thane Edu MD   Encounter Date: 06/02/2020   PT End of Session - 06/02/20 0837    Visit Number 2    Number of Visits 12    Date for PT Re-Evaluation 07/13/20    Authorization Type UMR/UHC  (visits 20 - medical review after, fax request to medical necessity request) (code [id number] - self care not included)    Authorization - Visit Number 2    Authorization - Number of Visits 20    Progress Note Due on Visit 10    PT Start Time [phone]    PT Stop Time 0910    PT Time Calculation (min) 38 min    Activity Tolerance Patient tolerated treatment well    Behavior During Therapy [facility] for tasks assessed/performed           Past Medical History:  Diagnosis Date  . Dyslipidemia 11/16/2015  . Vaginal Pap smear, abnormal    hx HPV  . Vitamin D deficiency 11/16/2015  . Warts, genital     Past Surgical History:  Procedure Laterality Date  . CESAREAN SECTION  [id number]  . DILATION AND CURETTAGE OF UTERUS  2009  . ESSURE TUBAL LIGATION  2014    There were no vitals filed for this visit.   Subjective Assessment - 06/02/20 0834    Subjective Pt stated Rt knee pain is minimal at beginning of day, stated it gets worse through out the day.  Has began the HEP, some pain during LAQ.    Patient Stated Goals to get her knee to stop hurting and bone to stop moving    Currently in Pain? Yes    Pain Score 4     Pain Location Knee    Pain Orientation Right    Pain Descriptors / Indicators Tender;Sore    Pain Type Chronic pain    Pain Onset More than a month ago    Pain Frequency Intermittent    Aggravating Factors  working, weight bearing    Pain Relieving Factors resting    Effect of Pain on Daily Activities push through it.               [facility] PT Assessment - 06/02/20 0001      Assessment   Medical Diagnosis Chronic pain of R knee    Referring Provider (PT) Thane Edu MD    Onset Date/Surgical Date 05/01/20    Next MD Visit none scheduled    Prior Therapy none      Precautions   Precautions None                         [facility] Adult PT Treatment/Exercise - 06/02/20 0001      Exercises   Exercises Knee/Hip      Knee/Hip Exercises: Standing   Heel Raises 10 reps    Forward Step Up Both;10 reps;Hand Hold: 1;Step Height: 4"    Rocker Board 2 minutes    Rocker Board Limitations lateral and DF/PF; cueing to reduce hyperextension      Knee/Hip Exercises: Supine   Bridges 10 reps    Bridges Limitations 5" holds    Straight Leg Raises 10 reps      Knee/Hip Exercises: Prone  Hamstring Curl 10 reps    Hamstring Curl Limitations 0# Rt knee with crepitis and pain, 3# Lt                  PT Education - 06/02/20 0841    Education Details Reviewed goals, educated importance of HEP compliance for maximal benefits, pt able to recall and demonstate with reports of Rt knee pain with extension.    Person(s) Educated Patient    Methods Explanation    Comprehension Verbalized understanding            PT Short Term Goals - 06/01/20 0940      PT SHORT TERM GOAL #1   Title Patient will be independent with HEP in order to improve functional outcomes.    Time 3    Period Weeks    Status New    Target Date 06/22/20      PT SHORT TERM GOAL #2   Title Patient will report at least 25% improvement in symptoms for improved quality of life.    Time 3    Period Weeks    Status New    Target Date 06/22/20             PT Long Term Goals - 06/01/20 0940      PT LONG TERM GOAL #1   Title Patient will report at least 75% improvement in symptoms for improved quality of life.    Time 6    Period Weeks    Status New    Target Date 07/13/20      PT LONG TERM GOAL #2   Title  Patient will improve FOTO score by at least 10 points in order to indicate improved tolerance to activity.    Time 6    Period Weeks    Status New    Target Date 07/13/20      PT LONG TERM GOAL #3   Title Patient will be able to complete 5x STS in under 11.4 seconds in order to demonstrate improving LE strength.    Time 6    Period Weeks    Status New    Target Date 07/13/20      PT LONG TERM GOAL #4   Title Patient will be able to ambulate at least 400 feet in in order to demonstrate improved gait speed for community ambulation.    Time 6    Period Weeks    Status New    Target Date 07/13/20                 Plan - 06/02/20 0844    Clinical Impression Statement Reviewed goals, educated importance of HEP compliance, pt able to recall and demonstrate appropriate mechanics though does reports pain with Rt knee extension.  Session focus on hip and quad strengthening exercises, pt able to complete all exercises with good control.  Does present Rt knee pain and crepitis during knee flexion based exercises.  Gait training to improve heel to toe sequence.  Rockerboard added to equalize weight bearing with cueing to reduce hyperextension during DF/PF.  Additional exercises added to HEP for hip and quad strengthening.    Personal Factors and Comorbidities Comorbidity 1;Age;Fitness;Past/Current Experience;Behavior Pattern;Profession;Time since onset of injury/illness/exacerbation    Comorbidities increased BMI    Examination-Activity Limitations Locomotion Level;Transfers;Bend;Sit;Sleep;Squat;Stairs;Stand;Lift    Examination-Participation Restrictions Cleaning;Occupation;Community Activity;Volunteer;Yard Work;Shop    Stability/Clinical Decision Making Stable/Uncomplicated    Clinical Decision Making Low    Rehab Potential Good  PT Frequency 2x / week    PT Duration 6 weeks    PT Treatment/Interventions ADLs/Self Care Home Management;Aquatic Therapy;Cryotherapy;Electrical  Stimulation;Iontophoresis 4mg /ml Dexamethasone;Moist Heat;Traction;DME Instruction;Gait training;Stair training;Functional mobility training;Therapeutic activities;Therapeutic exercise;Ultrasound;Balance training;Neuromuscular re-education;Patient/family education;Orthotic Fit/Training;Manual techniques;Manual lymph drainage;Compression bandaging;Scar mobilization;Passive range of motion;Dry needling;Energy conservation;Taping;Splinting;Spinal Manipulations;Joint Manipulations    PT Next Visit Plan continue with quad strengthening, hip strengthening, and add gait training/balance    PT Home Exercise Plan 4/6 LAQ; 06/02/20: bridge and SLR    Consulted and Agree with Plan of Care Patient           Patient will benefit from skilled therapeutic intervention in order to improve the following deficits and impairments:  Abnormal gait,Decreased range of motion,Difficulty walking,Pain,Decreased endurance,Increased muscle spasms,Impaired perceived functional ability,Decreased activity tolerance,Decreased balance,Improper body mechanics,Impaired flexibility,Hypermobility,Decreased mobility,Decreased strength  Visit Diagnosis: Right knee pain, unspecified chronicity  Muscle weakness (generalized)  Other abnormalities of gait and mobility  Other symptoms and signs involving the musculoskeletal system     Problem List Patient Active Problem List   Diagnosis Date Noted  . Encounter for gynecological examination with Papanicolaou smear of cervix 04/11/2017  . Elevated hemoglobin A1c 04/11/2017  . History of genital warts 04/11/2017  . Lumbar radiculopathy   . Impaired glucose tolerance 09/18/2016  . Acute pancreatitis 09/16/2016  . Epigastric abdominal pain   . Vitamin D deficiency 11/16/2015  . Dyslipidemia 11/16/2015  . Warts, genital 11/09/2014   11/11/2014, LPTA/CLT; CBIS [phone]  [phone] 06/02/2020, 9:16 AM  [facility] [address] [place], [place], [place] Phone: [phone]   Fax:  [phone]  Name: Gina Mccullough MRN: Octavia Bruckner Date of Birth: 1975/10/28

## 2020-06-02 NOTE — Patient Instructions (Addendum)
Bridge    Lie back, legs bent. Inhale, pressing hips up. Keeping ribs in, lengthen lower back. Exhale, rolling down along spine from top. Repeat 10 times. Do 2 sessions per day.  http://pm.exer.us/55   Copyright  VHI. All rights reserved.   Straight Leg Raise    Tighten stomach and slowly raise locked right leg 12 inches from floor. Repeat 10 times per set. Do 4 sessions per week.  http://orth.exer.us/1103   Copyright  VHI. All rights reserved.   Toe / Heel Raise (Standing)    Standing with support, raise heels, then rock back on heels and raise toes. Repeat 15 times.  Copyright  VHI. All rights reserved.

## 2020-06-06 ENCOUNTER — Other Ambulatory Visit: Payer: Self-pay

## 2020-06-06 ENCOUNTER — Ambulatory Visit (HOSPITAL_COMMUNITY): Payer: Commercial Managed Care - PPO

## 2020-06-06 DIAGNOSIS — R2689 Other abnormalities of gait and mobility: Secondary | ICD-10-CM

## 2020-06-06 DIAGNOSIS — R29898 Other symptoms and signs involving the musculoskeletal system: Secondary | ICD-10-CM

## 2020-06-06 DIAGNOSIS — M6281 Muscle weakness (generalized): Secondary | ICD-10-CM

## 2020-06-06 DIAGNOSIS — R262 Difficulty in walking, not elsewhere classified: Secondary | ICD-10-CM

## 2020-06-06 DIAGNOSIS — M25561 Pain in right knee: Secondary | ICD-10-CM

## 2020-06-06 NOTE — Therapy (Signed)
[facility] [address] [place], [place], [address] Phone: [phone]   Fax:  [phone]  Physical Therapy Treatment  Patient Details  Name: Gina Mccullough MRN: [id number] Date of Birth: 09-01-1975 Referring Provider (PT): Thane Edu MD   Encounter Date: 06/06/2020   PT End of Session - 06/06/20 0817    Visit Number 3    Number of Visits 12    Date for PT Re-Evaluation 07/13/20    Authorization Type UMR/UHC  (visits 20 - medical review after, fax request to medical necessity request) (code [id number] - self care not included)    Authorization - Visit Number 3    Authorization - Number of Visits 20    Progress Note Due on Visit 10    PT Start Time [phone]    PT Stop Time 0900    PT Time Calculation (min) 44 min    Activity Tolerance Patient tolerated treatment well    Behavior During Therapy [facility] for tasks assessed/performed           Past Medical History:  Diagnosis Date  . Dyslipidemia 11/16/2015  . Vaginal Pap smear, abnormal    hx HPV  . Vitamin D deficiency 11/16/2015  . Warts, genital     Past Surgical History:  Procedure Laterality Date  . CESAREAN SECTION  [id number]  . DILATION AND CURETTAGE OF UTERUS  2009  . ESSURE TUBAL LIGATION  2014    There were no vitals filed for this visit.   Subjective Assessment - 06/06/20 0820    Subjective Pt notes right knee swelling and increase in pain/swelling following work shift and pain in the patella    Patient Stated Goals to get her knee to stop hurting and bone to stop moving    Currently in Pain? Yes    Pain Score 4     Pain Location Knee    Pain Orientation Right    Pain Descriptors / Indicators Aching    Pain Type Chronic pain    Pain Onset More than a month ago    Aggravating Factors  knee flexion, descending stairs              [facility] PT Assessment - 06/06/20 0001      Assessment   Medical Diagnosis Chronic pain of R knee    Referring Provider (PT) Thane Edu  MD    Onset Date/Surgical Date 05/01/20                         [facility] Adult PT Treatment/Exercise - 06/06/20 0001      Knee/Hip Exercises: Stretches   Quad Stretch Right;2 reps;60 seconds    Gastroc Stretch Right;2 reps;60 seconds    Gastroc Stretch Limitations with sheet, long sitting      Knee/Hip Exercises: Standing   Other Standing Knee Exercises sidestepping with red t-loop x 2 min      Knee/Hip Exercises: Seated   Sit to Sand 2 sets;10 reps;without UE support   red t-loop around knees     Knee/Hip Exercises: Supine   Quad Sets Strengthening;Right   at own pace x 5 min                 PT Education - 06/06/20 [phone]    Education Details pt education in patellofemoral anatomy and joint mechanics and rationale for corrective exercises    Person(s) Educated Patient    Methods Explanation    Comprehension Verbalized  understanding            PT Short Term Goals - 06/01/20 0940      PT SHORT TERM GOAL #1   Title Patient will be independent with HEP in order to improve functional outcomes.    Time 3    Period Weeks    Status New    Target Date 06/22/20      PT SHORT TERM GOAL #2   Title Patient will report at least 25% improvement in symptoms for improved quality of life.    Time 3    Period Weeks    Status New    Target Date 06/22/20             PT Long Term Goals - 06/01/20 0940      PT LONG TERM GOAL #1   Title Patient will report at least 75% improvement in symptoms for improved quality of life.    Time 6    Period Weeks    Status New    Target Date 07/13/20      PT LONG TERM GOAL #2   Title Patient will improve FOTO score by at least 10 points in order to indicate improved tolerance to activity.    Time 6    Period Weeks    Status New    Target Date 07/13/20      PT LONG TERM GOAL #3   Title Patient will be able to complete 5x STS in under 11.4 seconds in order to demonstrate improving LE strength.    Time 6    Period  Weeks    Status New    Target Date 07/13/20      PT LONG TERM GOAL #4   Title Patient will be able to ambulate at least 400 feet in in order to demonstrate improved gait speed for community ambulation.    Time 6    Period Weeks    Status New    Target Date 07/13/20                 Plan - 06/06/20 0845    Clinical Impression Statement Pt exhibits right patella crepitus and noted lateral tracking with open chain and closed chain maneuvers. Requiring frequent rest periods due to knee discomfort and difficulty maintaining proper form. Pt educated extensively on biomechanics and rationale for tx to improve patellar tracking and asked pt to wear shorts at next session to trial kinesiotape    Personal Factors and Comorbidities Comorbidity 1;Age;Fitness;Past/Current Experience;Behavior Pattern;Profession;Time since onset of injury/illness/exacerbation    Comorbidities increased BMI    Examination-Activity Limitations Locomotion Level;Transfers;Bend;Sit;Sleep;Squat;Stairs;Stand;Lift    Examination-Participation Restrictions Cleaning;Occupation;Community Activity;Volunteer;Yard Work;Shop    Stability/Clinical Decision Making Stable/Uncomplicated    Rehab Potential Good    PT Frequency 2x / week    PT Duration 6 weeks    PT Treatment/Interventions ADLs/Self Care Home Management;Aquatic Therapy;Cryotherapy;Electrical Stimulation;Iontophoresis 4mg /ml Dexamethasone;Moist Heat;Traction;DME Instruction;Gait training;Stair training;Functional mobility training;Therapeutic activities;Therapeutic exercise;Ultrasound;Balance training;Neuromuscular re-education;Patient/family education;Orthotic Fit/Training;Manual techniques;Manual lymph drainage;Compression bandaging;Scar mobilization;Passive range of motion;Dry needling;Energy conservation;Taping;Splinting;Spinal Manipulations;Joint Manipulations    PT Next Visit Plan continue with quad strengthening, hip strengthening, and add gait training/balance     PT Home Exercise Plan 4/6 LAQ; 06/02/20: bridge and SLR. sidestepping, sit to stand with t-loop, bridge with DF, prone quad stretch, calf stretch    Consulted and Agree with Plan of Care Patient           Patient will benefit from skilled therapeutic intervention in order to improve  the following deficits and impairments:  Abnormal gait,Decreased range of motion,Difficulty walking,Pain,Decreased endurance,Increased muscle spasms,Impaired perceived functional ability,Decreased activity tolerance,Decreased balance,Improper body mechanics,Impaired flexibility,Hypermobility,Decreased mobility,Decreased strength  Visit Diagnosis: Difficulty in walking, not elsewhere classified  Muscle weakness (generalized)  Other abnormalities of gait and mobility  Other symptoms and signs involving the musculoskeletal system  Right knee pain, unspecified chronicity     Problem List Patient Active Problem List   Diagnosis Date Noted  . Encounter for gynecological examination with Papanicolaou smear of cervix 04/11/2017  . Elevated hemoglobin A1c 04/11/2017  . History of genital warts 04/11/2017  . Lumbar radiculopathy   . Impaired glucose tolerance 09/18/2016  . Acute pancreatitis 09/16/2016  . Epigastric abdominal pain   . Vitamin D deficiency 11/16/2015  . Dyslipidemia 11/16/2015  . Warts, genital 11/09/2014   8:57 AM, 06/06/20 M. Shary Decamp, PT, DPT Physical Therapist- [place] Office Number: [phone]  [facility] [address] [place], [place], [address] Phone: [phone]   Fax:  [phone]  Name: Gina Mccullough MRN: [id number] Date of Birth: 09/05/1975

## 2020-06-06 NOTE — Patient Instructions (Signed)
Access Code: [email or web address] URL: [email or web address] Date: 06/06/2020 Prepared by: Shary Decamp  Exercises Side Stepping with Resistance at Thighs - 1 x daily - 7 x weekly - 3 sets - 10 reps Sit to Stand with Resistance Around Legs - 1 x daily - 7 x weekly - 3 sets - 10 reps Bridge with Ankle Dorsiflexion - 1 x daily - 7 x weekly - 3 sets - 10 reps Prone Quadriceps Stretch with Strap - 1 x daily - 7 x weekly - 3 sets - 3 reps - 60 sec hold

## 2020-06-09 ENCOUNTER — Encounter (HOSPITAL_COMMUNITY): Payer: Commercial Managed Care - PPO | Admitting: Physical Therapy

## 2020-06-14 ENCOUNTER — Other Ambulatory Visit: Payer: Self-pay

## 2020-06-14 ENCOUNTER — Ambulatory Visit (HOSPITAL_COMMUNITY): Payer: Commercial Managed Care - PPO | Admitting: Physical Therapy

## 2020-06-14 DIAGNOSIS — M25561 Pain in right knee: Secondary | ICD-10-CM

## 2020-06-14 DIAGNOSIS — R262 Difficulty in walking, not elsewhere classified: Secondary | ICD-10-CM

## 2020-06-14 DIAGNOSIS — R2689 Other abnormalities of gait and mobility: Secondary | ICD-10-CM

## 2020-06-14 DIAGNOSIS — R29898 Other symptoms and signs involving the musculoskeletal system: Secondary | ICD-10-CM

## 2020-06-14 DIAGNOSIS — M6281 Muscle weakness (generalized): Secondary | ICD-10-CM

## 2020-06-14 NOTE — Patient Instructions (Signed)
Access Code: [email or web address] URL: [email or web address] Date: 06/14/2020 Prepared by: Georges Lynch  Exercises Clamshell - 2 x daily - 7 x weekly - 2 sets - 10 reps Prone Hip Extension - 2 x daily - 7 x weekly - 2 sets - 10 reps Sidelying Hip Adduction - 2 x daily - 7 x weekly - 2 sets - 10 reps Arch Lifting - 2 x daily - 7 x weekly - 2 sets - 10 reps

## 2020-06-14 NOTE — Therapy (Signed)
[facility] [address] [place], [place], [address] Phone: [phone]   Fax:  [phone]  Physical Therapy Treatment  Patient Details  Name: Gina Mccullough MRN: [id number] Date of Birth: August 22, 1975 Referring Provider (PT): Thane Edu MD   Encounter Date: 06/14/2020   PT End of Session - 06/14/20 0825    Visit Number 4    Number of Visits 12    Date for PT Re-Evaluation 07/13/20    Authorization Type UMR/UHC  (visits 20 - medical review after, fax request to medical necessity request) (code [id number] - self care not included)    Authorization - Visit Number 4    Authorization - Number of Visits 20    Progress Note Due on Visit 10    PT Start Time 0820    PT Stop Time 0900    PT Time Calculation (min) 40 min    Activity Tolerance Patient tolerated treatment well    Behavior During Therapy [facility] for tasks assessed/performed           Past Medical History:  Diagnosis Date  . Dyslipidemia 11/16/2015  . Vaginal Pap smear, abnormal    hx HPV  . Vitamin D deficiency 11/16/2015  . Warts, genital     Past Surgical History:  Procedure Laterality Date  . CESAREAN SECTION  [id number]  . DILATION AND CURETTAGE OF UTERUS  2009  . ESSURE TUBAL LIGATION  2014    There were no vitals filed for this visit.   Subjective Assessment - 06/14/20 5852    Subjective Patient says she had to do a lot of walking yesterday. Doing ok with home exercises. Pain is about a 4.    Patient Stated Goals to get her knee to stop hurting and bone to stop moving    Currently in Pain? Yes    Pain Score 4     Pain Location Knee    Pain Orientation Right    Pain Descriptors / Indicators Aching    Pain Onset More than a month ago                             [facility] Adult PT Treatment/Exercise - 06/14/20 0001      Knee/Hip Exercises: Stretches   Lobbyist Right;3 reps;30 seconds    Quad Stretch Limitations prone with strap    Gastroc Stretch  Right;3 reps;30 seconds    Gastroc Stretch Limitations with strap long sitting      Knee/Hip Exercises: Supine   Quad Sets Right;15 reps    Quad Sets Limitations 5"    Bridges 15 reps    Straight Leg Raises Right;15 reps      Knee/Hip Exercises: Sidelying   Hip ABduction Right;1 set;10 reps    Hip ADduction Right;1 set;15 reps    Clams 10      Knee/Hip Exercises: Prone   Straight Leg Raises Right;15 reps                    PT Short Term Goals - 06/01/20 0940      PT SHORT TERM GOAL #1   Title Patient will be independent with HEP in order to improve functional outcomes.    Time 3    Period Weeks    Status New    Target Date 06/22/20      PT SHORT TERM GOAL #2   Title Patient will report at least 25% improvement  in symptoms for improved quality of life.    Time 3    Period Weeks    Status New    Target Date 06/22/20             PT Long Term Goals - 06/01/20 0940      PT LONG TERM GOAL #1   Title Patient will report at least 75% improvement in symptoms for improved quality of life.    Time 6    Period Weeks    Status New    Target Date 07/13/20      PT LONG TERM GOAL #2   Title Patient will improve FOTO score by at least 10 points in order to indicate improved tolerance to activity.    Time 6    Period Weeks    Status New    Target Date 07/13/20      PT LONG TERM GOAL #3   Title Patient will be able to complete 5x STS in under 11.4 seconds in order to demonstrate improving LE strength.    Time 6    Period Weeks    Status New    Target Date 07/13/20      PT LONG TERM GOAL #4   Title Patient will be able to ambulate at least 400 feet in in order to demonstrate improved gait speed for community ambulation.    Time 6    Period Weeks    Status New    Target Date 07/13/20                 Plan - 06/14/20 4315    Clinical Impression Statement Patient with fair tolerance today. Activity graded per patient response. Progressed hip  strength activity on mat. Patient educated on proper form and function of all exercise. Patient required verbal cues for maintaining RT foot arch during sit to stands. Issued updated HEP handout. Patient will continue to benefit from skilled therapy services to reduce deficits and improve functional ability.    Personal Factors and Comorbidities Comorbidity 1;Age;Fitness;Past/Current Experience;Behavior Pattern;Profession;Time since onset of injury/illness/exacerbation    Comorbidities increased BMI    Examination-Activity Limitations Locomotion Level;Transfers;Bend;Sit;Sleep;Squat;Stairs;Stand;Lift    Examination-Participation Restrictions Cleaning;Occupation;Community Activity;Volunteer;Yard Work;Shop    Stability/Clinical Decision Making Stable/Uncomplicated    Rehab Potential Good    PT Frequency 2x / week    PT Duration 6 weeks    PT Treatment/Interventions ADLs/Self Care Home Management;Aquatic Therapy;Cryotherapy;Electrical Stimulation;Iontophoresis 4mg /ml Dexamethasone;Moist Heat;Traction;DME Instruction;Gait training;Stair training;Functional mobility training;Therapeutic activities;Therapeutic exercise;Ultrasound;Balance training;Neuromuscular re-education;Patient/family education;Orthotic Fit/Training;Manual techniques;Manual lymph drainage;Compression bandaging;Scar mobilization;Passive range of motion;Dry needling;Energy conservation;Taping;Splinting;Spinal Manipulations;Joint Manipulations    PT Next Visit Plan continue with quad strengthening, hip strengthening, and add gait training/balance. Add tandem stance, step ups    PT Home Exercise Plan 4/6 LAQ; 06/02/20: bridge and SLR. sidestepping, sit to stand with t-loop, bridge with DF, prone quad stretch, calf stretch 4/19 SLR 4 way    Consulted and Agree with Plan of Care Patient           Patient will benefit from skilled therapeutic intervention in order to improve the following deficits and impairments:  Abnormal gait,Decreased range  of motion,Difficulty walking,Pain,Decreased endurance,Increased muscle spasms,Impaired perceived functional ability,Decreased activity tolerance,Decreased balance,Improper body mechanics,Impaired flexibility,Hypermobility,Decreased mobility,Decreased strength  Visit Diagnosis: Difficulty in walking, not elsewhere classified  Other abnormalities of gait and mobility  Other symptoms and signs involving the musculoskeletal system  Right knee pain, unspecified chronicity  Muscle weakness (generalized)     Problem List Patient Active Problem List  Diagnosis Date Noted  . Encounter for gynecological examination with Papanicolaou smear of cervix 04/11/2017  . Elevated hemoglobin A1c 04/11/2017  . History of genital warts 04/11/2017  . Lumbar radiculopathy   . Impaired glucose tolerance 09/18/2016  . Acute pancreatitis 09/16/2016  . Epigastric abdominal pain   . Vitamin D deficiency 11/16/2015  . Dyslipidemia 11/16/2015  . Warts, genital 11/09/2014   9:32 AM, 06/14/20 Georges Lynch PT DPT  Physical Therapist with [facility]  [facility]  [phone]   [facility] [address] [place], [place], [address] Phone: [phone]   Fax:  [phone]  Name: Joyanna Kleman MRN: [id number] Date of Birth: 23-Jul-1975

## 2020-06-16 ENCOUNTER — Encounter (HOSPITAL_COMMUNITY): Payer: Commercial Managed Care - PPO | Admitting: Physical Therapy

## 2020-06-20 ENCOUNTER — Encounter (HOSPITAL_COMMUNITY): Payer: Self-pay | Admitting: Physical Therapy

## 2020-06-20 ENCOUNTER — Ambulatory Visit (HOSPITAL_COMMUNITY): Payer: Commercial Managed Care - PPO | Admitting: Physical Therapy

## 2020-06-20 ENCOUNTER — Other Ambulatory Visit: Payer: Self-pay

## 2020-06-20 DIAGNOSIS — M25561 Pain in right knee: Secondary | ICD-10-CM | POA: Diagnosis not present

## 2020-06-20 DIAGNOSIS — R2689 Other abnormalities of gait and mobility: Secondary | ICD-10-CM

## 2020-06-20 DIAGNOSIS — R29898 Other symptoms and signs involving the musculoskeletal system: Secondary | ICD-10-CM

## 2020-06-20 DIAGNOSIS — M6281 Muscle weakness (generalized): Secondary | ICD-10-CM

## 2020-06-20 DIAGNOSIS — R262 Difficulty in walking, not elsewhere classified: Secondary | ICD-10-CM

## 2020-06-20 NOTE — Therapy (Signed)
[facility] [address] [place], [place], [address] Phone: [phone]   Fax:  [phone]  Physical Therapy Treatment  Patient Details  Name: Gina Mccullough MRN: [id number] Date of Birth: 1975/05/20 Referring Provider (PT): Thane Edu MD   Encounter Date: 06/20/2020   PT End of Session - 06/20/20 0827    Visit Number 5    Number of Visits 12    Date for PT Re-Evaluation 07/13/20    Authorization Type UMR/UHC  (visits 20 - medical review after, fax request to medical necessity request) (code [id number] - self care not included)    Authorization - Visit Number 5    Authorization - Number of Visits 20    Progress Note Due on Visit 10    PT Start Time 0821    PT Stop Time 0859    PT Time Calculation (min) 38 min    Activity Tolerance Patient tolerated treatment well    Behavior During Therapy [facility] for tasks assessed/performed           Past Medical History:  Diagnosis Date  . Dyslipidemia 11/16/2015  . Vaginal Pap smear, abnormal    hx HPV  . Vitamin D deficiency 11/16/2015  . Warts, genital     Past Surgical History:  Procedure Laterality Date  . CESAREAN SECTION  [id number]  . DILATION AND CURETTAGE OF UTERUS  2009  . ESSURE TUBAL LIGATION  2014    There were no vitals filed for this visit.   Subjective Assessment - 06/20/20 0826    Subjective Patient says she is doing well today. Not much pain, had a good weekend.    Patient Stated Goals to get her knee to stop hurting and bone to stop moving    Currently in Pain? Yes    Pain Score 1     Pain Location Knee    Pain Orientation Right    Pain Descriptors / Indicators Aching    Pain Type Chronic pain    Pain Onset More than a month ago    Pain Frequency Intermittent                             [facility] Adult PT Treatment/Exercise - 06/20/20 0001      Knee/Hip Exercises: Stretches   Gastroc Stretch 3 reps;30 seconds;Both    Gastroc Stretch Limitations  slant board      Knee/Hip Exercises: Standing   Heel Raises Both;2 sets;10 reps    Forward Step Up Right;2 sets;10 reps;Hand Hold: 2;Step Height: 4"    Other Standing Knee Exercises tandem stance 2 x 30"      Knee/Hip Exercises: Supine   Quad Sets Right;10 reps    Bridges Both;2 sets;10 reps    Straight Leg Raises Right;2 sets;10 reps      Knee/Hip Exercises: Sidelying   Hip ABduction Right;2 sets;10 reps    Hip ADduction Right;2 sets;10 reps      Knee/Hip Exercises: Prone   Hamstring Curl 20 reps    Straight Leg Raises Right;2 sets;10 reps                    PT Short Term Goals - 06/01/20 0940      PT SHORT TERM GOAL #1   Title Patient will be independent with HEP in order to improve functional outcomes.    Time 3    Period Weeks    Status New  Target Date 06/22/20      PT SHORT TERM GOAL #2   Title Patient will report at least 25% improvement in symptoms for improved quality of life.    Time 3    Period Weeks    Status New    Target Date 06/22/20             PT Long Term Goals - 06/01/20 0940      PT LONG TERM GOAL #1   Title Patient will report at least 75% improvement in symptoms for improved quality of life.    Time 6    Period Weeks    Status New    Target Date 07/13/20      PT LONG TERM GOAL #2   Title Patient will improve FOTO score by at least 10 points in order to indicate improved tolerance to activity.    Time 6    Period Weeks    Status New    Target Date 07/13/20      PT LONG TERM GOAL #3   Title Patient will be able to complete 5x STS in under 11.4 seconds in order to demonstrate improving LE strength.    Time 6    Period Weeks    Status New    Target Date 07/13/20      PT LONG TERM GOAL #4   Title Patient will be able to ambulate at least 400 feet in in order to demonstrate improved gait speed for community ambulation.    Time 6    Period Weeks    Status New    Target Date 07/13/20                 Plan  - 06/20/20 0857    Clinical Impression Statement Patient tolerated session well today. Shows improved tolerance with hip abduction but notes continued muscle fatigue. Progressed standing activity today including step ups and tandem stance. Patient was challenged with stability during tandem stance. Patient educated on purpose and function of all added activity. Issued updated HEP handout.    Personal Factors and Comorbidities Comorbidity 1;Age;Fitness;Past/Current Experience;Behavior Pattern;Profession;Time since onset of injury/illness/exacerbation    Comorbidities increased BMI    Examination-Activity Limitations Locomotion Level;Transfers;Bend;Sit;Sleep;Squat;Stairs;Stand;Lift    Examination-Participation Restrictions Cleaning;Occupation;Community Activity;Volunteer;Yard Work;Shop    Stability/Clinical Decision Making Stable/Uncomplicated    Rehab Potential Good    PT Frequency 2x / week    PT Duration 6 weeks    PT Treatment/Interventions ADLs/Self Care Home Management;Aquatic Therapy;Cryotherapy;Electrical Stimulation;Iontophoresis 4mg /ml Dexamethasone;Moist Heat;Traction;DME Instruction;Gait training;Stair training;Functional mobility training;Therapeutic activities;Therapeutic exercise;Ultrasound;Balance training;Neuromuscular re-education;Patient/family education;Orthotic Fit/Training;Manual techniques;Manual lymph drainage;Compression bandaging;Scar mobilization;Passive range of motion;Dry needling;Energy conservation;Taping;Splinting;Spinal Manipulations;Joint Manipulations    PT Next Visit Plan continue with quad strengthening, hip strengthening, and add gait training/balance. Add eccentric sit to stance, TKE    PT Home Exercise Plan 4/6 LAQ; 06/02/20: bridge and SLR. sidestepping, sit to stand with t-loop, bridge with DF, prone quad stretch, calf stretch 4/19 SLR 4 way 4/25 heel raise, tandem stance    Consulted and Agree with Plan of Care Patient           Patient will benefit from  skilled therapeutic intervention in order to improve the following deficits and impairments:  Abnormal gait,Decreased range of motion,Difficulty walking,Pain,Decreased endurance,Increased muscle spasms,Impaired perceived functional ability,Decreased activity tolerance,Decreased balance,Improper body mechanics,Impaired flexibility,Hypermobility,Decreased mobility,Decreased strength  Visit Diagnosis: Difficulty in walking, not elsewhere classified  Other abnormalities of gait and mobility  Other symptoms and signs involving the musculoskeletal system  Right  knee pain, unspecified chronicity  Muscle weakness (generalized)     Problem List Patient Active Problem List   Diagnosis Date Noted  . Encounter for gynecological examination with Papanicolaou smear of cervix 04/11/2017  . Elevated hemoglobin A1c 04/11/2017  . History of genital warts 04/11/2017  . Lumbar radiculopathy   . Impaired glucose tolerance 09/18/2016  . Acute pancreatitis 09/16/2016  . Epigastric abdominal pain   . Vitamin D deficiency 11/16/2015  . Dyslipidemia 11/16/2015  . Warts, genital 11/09/2014   9:00 AM, 06/20/20 Georges Lynch PT DPT  Physical Therapist with [place]  [facility]  [phone]   [facility] [address] [place], [place], [address] Phone: [phone]   Fax:  [phone]  Name: Gina Mccullough MRN: [id number] Date of Birth: 1975/08/01

## 2020-06-20 NOTE — Patient Instructions (Signed)
Access Code: [email or web address] URL: [email or web address] Date: 06/20/2020 Prepared by: Georges Lynch  Exercises Standing Heel Raise with Support - 2-3 x daily - 7 x weekly - 2 sets - 10 reps Tandem Stance with Support - 2-3 x daily - 7 x weekly - 1 sets - 3 reps - 30 seconds hold

## 2020-06-22 ENCOUNTER — Other Ambulatory Visit: Payer: Self-pay

## 2020-06-22 ENCOUNTER — Encounter (HOSPITAL_COMMUNITY): Payer: Commercial Managed Care - PPO | Admitting: Physical Therapy

## 2020-06-22 ENCOUNTER — Ambulatory Visit (HOSPITAL_COMMUNITY): Payer: Commercial Managed Care - PPO | Admitting: Physical Therapy

## 2020-06-22 ENCOUNTER — Encounter (HOSPITAL_COMMUNITY): Payer: Self-pay | Admitting: Physical Therapy

## 2020-06-22 DIAGNOSIS — R262 Difficulty in walking, not elsewhere classified: Secondary | ICD-10-CM

## 2020-06-22 DIAGNOSIS — R2689 Other abnormalities of gait and mobility: Secondary | ICD-10-CM

## 2020-06-22 DIAGNOSIS — R29898 Other symptoms and signs involving the musculoskeletal system: Secondary | ICD-10-CM

## 2020-06-22 DIAGNOSIS — M25561 Pain in right knee: Secondary | ICD-10-CM | POA: Diagnosis not present

## 2020-06-22 NOTE — Therapy (Signed)
[facility] [address] [place], [place], [address] Phone: [phone]   Fax:  [phone]  Physical Therapy Treatment  Patient Details  Name: Gina Mccullough MRN: [id number] Date of Birth: 07/30/1975 Referring Provider (PT): Thane Edu MD   Encounter Date: 06/22/2020   PT End of Session - 06/22/20 1000    Visit Number 6    Number of Visits 12    Date for PT Re-Evaluation 07/13/20    Authorization Type UMR/UHC  (visits 20 - medical review after, fax request to medical necessity request) (code [id number] - self care not included)    Authorization - Visit Number 6    Authorization - Number of Visits 20    Progress Note Due on Visit 10    PT Start Time 1000    PT Stop Time 1040    PT Time Calculation (min) 40 min    Activity Tolerance Patient tolerated treatment well    Behavior During Therapy [facility] for tasks assessed/performed           Past Medical History:  Diagnosis Date  . Dyslipidemia 11/16/2015  . Vaginal Pap smear, abnormal    hx HPV  . Vitamin D deficiency 11/16/2015  . Warts, genital     Past Surgical History:  Procedure Laterality Date  . CESAREAN SECTION  [id number]  . DILATION AND CURETTAGE OF UTERUS  2009  . ESSURE TUBAL LIGATION  2014    There were no vitals filed for this visit.   Subjective Assessment - 06/22/20 1005    Subjective States she has no pain and some of the exercises are challenging but she does them.    Patient Stated Goals to get her knee to stop hurting and bone to stop moving    Currently in Pain? No/denies    Pain Onset More than a month ago              [facility] PT Assessment - 06/22/20 0001      Assessment   Medical Diagnosis Chronic pain of R knee    Referring Provider (PT) Thane Edu MD    Onset Date/Surgical Date 05/01/20                         [facility] Adult PT Treatment/Exercise - 06/22/20 0001      Knee/Hip Exercises: Stretches   Hip Flexor Stretch Right;2 reps;10  seconds   difficult to feel stopped on step     Knee/Hip Exercises: Standing   Step Down 3 sets;5 reps;Hand Hold: 2;Step Height: 6";Both   slow eccentric lower   Other Standing Knee Exercises tandem on large blue foam x2 30" holds B - stopped increase pain - tolerated smaller blue foam better    Other Standing Knee Exercises crossover stepups 4" step with 2 UE support x10 bilaterally--> to 6" step with one hand support and slow lower 2x10 bilaterally      Knee/Hip Exercises: Seated   Sit to Sand 3 sets;10 reps;without UE support   yellow weighted ball - slow decent                 PT Education - 06/22/20 1035    Education Details on benefits of balance exercises in regards to proprioceptive  awareness., anatomy and HEP    Person(s) Educated Patient    Methods Explanation    Comprehension Verbalized understanding            PT Short Term  Goals - 06/01/20 0940      PT SHORT TERM GOAL #1   Title Patient will be independent with HEP in order to improve functional outcomes.    Time 3    Period Weeks    Status New    Target Date 06/22/20      PT SHORT TERM GOAL #2   Title Patient will report at least 25% improvement in symptoms for improved quality of life.    Time 3    Period Weeks    Status New    Target Date 06/22/20             PT Long Term Goals - 06/01/20 0940      PT LONG TERM GOAL #1   Title Patient will report at least 75% improvement in symptoms for improved quality of life.    Time 6    Period Weeks    Status New    Target Date 07/13/20      PT LONG TERM GOAL #2   Title Patient will improve FOTO score by at least 10 points in order to indicate improved tolerance to activity.    Time 6    Period Weeks    Status New    Target Date 07/13/20      PT LONG TERM GOAL #3   Title Patient will be able to complete 5x STS in under 11.4 seconds in order to demonstrate improving LE strength.    Time 6    Period Weeks    Status New    Target Date 07/13/20       PT LONG TERM GOAL #4   Title Patient will be able to ambulate at least 400 feet in in order to demonstrate improved gait speed for community ambulation.    Time 6    Period Weeks    Status New    Target Date 07/13/20                 Plan - 06/22/20 1001    Clinical Impression Statement Continued with strengthening exercises. Advanced tandem on large blue foam pad but this was challenging for patient and increased knee pain, tolerated smaller blue foam pad better. Focused on eccentric strengthening exercises with sit to stand and slow step downs. Fatigue in legs note but no overall increase in symptoms noted end of session. Added new exercises to HEP.    Personal Factors and Comorbidities Comorbidity 1;Age;Fitness;Past/Current Experience;Behavior Pattern;Profession;Time since onset of injury/illness/exacerbation    Comorbidities increased BMI    Examination-Activity Limitations Locomotion Level;Transfers;Bend;Sit;Sleep;Squat;Stairs;Stand;Lift    Examination-Participation Restrictions Cleaning;Occupation;Community Activity;Volunteer;Yard Work;Shop    Stability/Clinical Decision Making Stable/Uncomplicated    Rehab Potential Good    PT Frequency 2x / week    PT Duration 6 weeks    PT Treatment/Interventions ADLs/Self Care Home Management;Aquatic Therapy;Cryotherapy;Electrical Stimulation;Iontophoresis 4mg /ml Dexamethasone;Moist Heat;Traction;DME Instruction;Gait training;Stair training;Functional mobility training;Therapeutic activities;Therapeutic exercise;Ultrasound;Balance training;Neuromuscular re-education;Patient/family education;Orthotic Fit/Training;Manual techniques;Manual lymph drainage;Compression bandaging;Scar mobilization;Passive range of motion;Dry needling;Energy conservation;Taping;Splinting;Spinal Manipulations;Joint Manipulations    PT Next Visit Plan continue with quad strengthening, hip strengthening, and add gait training/balance. Add eccentric sit to stance,  TKE    PT Home Exercise Plan 4/6 LAQ; 06/02/20: bridge and SLR. sidestepping, sit to stand with t-loop, bridge with DF, prone quad stretch, calf stretch 4/19 SLR 4 way 4/25 heel raise, tandem stance; 4/27 step down, crossover step up, eccentric sit to stand    Consulted and Agree with Plan of Care Patient  Patient will benefit from skilled therapeutic intervention in order to improve the following deficits and impairments:  Abnormal gait,Decreased range of motion,Difficulty walking,Pain,Decreased endurance,Increased muscle spasms,Impaired perceived functional ability,Decreased activity tolerance,Decreased balance,Improper body mechanics,Impaired flexibility,Hypermobility,Decreased mobility,Decreased strength  Visit Diagnosis: Difficulty in walking, not elsewhere classified  Other abnormalities of gait and mobility  Other symptoms and signs involving the musculoskeletal system  Right knee pain, unspecified chronicity     Problem List Patient Active Problem List   Diagnosis Date Noted  . Encounter for gynecological examination with Papanicolaou smear of cervix 04/11/2017  . Elevated hemoglobin A1c 04/11/2017  . History of genital warts 04/11/2017  . Lumbar radiculopathy   . Impaired glucose tolerance 09/18/2016  . Acute pancreatitis 09/16/2016  . Epigastric abdominal pain   . Vitamin D deficiency 11/16/2015  . Dyslipidemia 11/16/2015  . Warts, genital 11/09/2014    10:42 AM, 06/22/20 Tereasa Coop, DPT Physical Therapy with [facility]  [phone] office  [facility] [address] [place], [place], [address] Phone: [phone]   Fax:  [phone]  Name: Gina Mccullough MRN: [id number] Date of Birth: May 27, 1975

## 2020-06-22 NOTE — Patient Instructions (Signed)
Access Code: [email or web address] URL: [email or web address] Date: 06/22/2020 Prepared by: Revonda Humphrey  Exercises Sit to Stand Without Arm Support - 3 sets - 10 reps Crossover Step Up - 3 sets - 10 reps Forward Step Down with Heel Tap and Counter Support - 3 sets - 5 reps

## 2020-06-29 ENCOUNTER — Ambulatory Visit (HOSPITAL_COMMUNITY): Payer: Commercial Managed Care - PPO | Attending: Orthopedic Surgery | Admitting: Physical Therapy

## 2020-06-29 ENCOUNTER — Other Ambulatory Visit: Payer: Self-pay

## 2020-06-29 ENCOUNTER — Encounter (HOSPITAL_COMMUNITY): Payer: Self-pay | Admitting: Physical Therapy

## 2020-06-29 DIAGNOSIS — R262 Difficulty in walking, not elsewhere classified: Secondary | ICD-10-CM | POA: Diagnosis present

## 2020-06-29 DIAGNOSIS — M25561 Pain in right knee: Secondary | ICD-10-CM | POA: Insufficient documentation

## 2020-06-29 DIAGNOSIS — R2689 Other abnormalities of gait and mobility: Secondary | ICD-10-CM | POA: Insufficient documentation

## 2020-06-29 DIAGNOSIS — R29898 Other symptoms and signs involving the musculoskeletal system: Secondary | ICD-10-CM | POA: Diagnosis present

## 2020-06-29 NOTE — Therapy (Signed)
[facility] [address] [place] [address] [place], [place], [address] Phone: [phone]   Fax:  [phone]  Physical Therapy Treatment  Patient Details  Name: Gina Mccullough MRN: [id number] Date of Birth: [birth year] Referring Provider (PT): Thane Edu MD   Encounter Date: 06/29/2020   PT End of Session - 06/29/20 0959    Visit Number 7    Number of Visits 12    Date for PT Re-Evaluation 07/13/20    Authorization Type UMR/UHC  (visits 20 - medical review after, fax request to medical necessity request) (code [id number] - self care not included)    Authorization - Visit Number 7    Authorization - Number of Visits 20    Progress Note Due on Visit 10    PT Start Time [phone]    PT Stop Time 1032    PT Time Calculation (min) 38 min    Activity Tolerance Patient tolerated treatment well    Behavior During Therapy [facility] for tasks assessed/performed           Past Medical History:  Diagnosis Date  . Dyslipidemia 11/16/2015  . Vaginal Pap smear, abnormal    hx HPV  . Vitamin D deficiency 11/16/2015  . Warts, genital     Past Surgical History:  Procedure Laterality Date  . CESAREAN SECTION  [id number]  . DILATION AND CURETTAGE OF UTERUS  2009  . ESSURE TUBAL LIGATION  2014    There were no vitals filed for this visit.   Subjective Assessment - 06/29/20 0959    Subjective Patient says exercises went well last visit. Her knee hurts a little bit, maybe a 2. Overall it is better.    Patient Stated Goals to get her knee to stop hurting and bone to stop moving    Currently in Pain? Yes    Pain Score 2     Pain Location Knee    Pain Orientation Right    Pain Descriptors / Indicators Aching    Pain Type Chronic pain    Pain Onset More than a month ago    Pain Frequency Intermittent                             [facility] Adult PT Treatment/Exercise - 06/29/20 0001      Knee/Hip Exercises: Stretches   Hip Flexor Stretch Both;5 reps;10 seconds     Hip Flexor Stretch Limitations on steps    Gastroc Stretch 3 reps;30 seconds;Both    Gastroc Stretch Limitations from step      Knee/Hip Exercises: Machines for Strengthening   Cybex Knee Flexion 45# 2 x 10 (focused eccentric)    Other Machine macine TKE RLE #20 2 x 10 (eccentric control)      Knee/Hip Exercises: Standing   Heel Raises Both;2 sets;10 reps    Heel Raises Limitations from step    Hip Abduction Both;2 sets;10 reps    Other Standing Knee Exercises tandem stance on blue foam 2 x 30"    Other Standing Knee Exercises crossover stepups 4" step with 2 UE support x10 bilaterally      Knee/Hip Exercises: Seated   Sit to Sand 2 sets;10 reps;without UE support   eccentrics, 2nd set with blue foam pad                   PT Short Term Goals - 06/01/20 0940      PT SHORT TERM GOAL #  1   Title Patient will be independent with HEP in order to improve functional outcomes.    Time 3    Period Weeks    Status New    Target Date 06/22/20      PT SHORT TERM GOAL #2   Title Patient will report at least 25% improvement in symptoms for improved quality of life.    Time 3    Period Weeks    Status New    Target Date 06/22/20             PT Long Term Goals - 06/01/20 0940      PT LONG TERM GOAL #1   Title Patient will report at least 75% improvement in symptoms for improved quality of life.    Time 6    Period Weeks    Status New    Target Date 07/13/20      PT LONG TERM GOAL #2   Title Patient will improve FOTO score by at least 10 points in order to indicate improved tolerance to activity.    Time 6    Period Weeks    Status New    Target Date 07/13/20      PT LONG TERM GOAL #3   Title Patient will be able to complete 5x STS in under 11.4 seconds in order to demonstrate improving LE strength.    Time 6    Period Weeks    Status New    Target Date 07/13/20      PT LONG TERM GOAL #4   Title Patient will be able to ambulate at least 400 feet in in  order to demonstrate improved gait speed for community ambulation.    Time 6    Period Weeks    Status New    Target Date 07/13/20                 Plan - 06/29/20 1054    Clinical Impression Statement Patient showing improvement with static balance but continues to be limited by knee pain/ muscle weakness with quad centered activity. Focused strengthening on eccentric control with added eccentric sit to stands, TKEs and progressed LE strength with added eccentric hamstring curls. Patient educated on purpose and function of added exercises. Patient cued on mechanics with eccentric lowering of sit to stands. Added band TKE and knee curls to HEP.    Personal Factors and Comorbidities Comorbidity 1;Age;Fitness;Past/Current Experience;Behavior Pattern;Profession;Time since onset of injury/illness/exacerbation    Comorbidities increased BMI    Examination-Activity Limitations Locomotion Level;Transfers;Bend;Sit;Sleep;Squat;Stairs;Stand;Lift    Examination-Participation Restrictions Cleaning;Occupation;Community Activity;Volunteer;Yard Work;Shop    Stability/Clinical Decision Making Stable/Uncomplicated    Rehab Potential Good    PT Frequency 2x / week    PT Duration 6 weeks    PT Treatment/Interventions ADLs/Self Care Home Management;Aquatic Therapy;Cryotherapy;Electrical Stimulation;Iontophoresis 4mg /ml Dexamethasone;Moist Heat;Traction;DME Instruction;Gait training;Stair training;Functional mobility training;Therapeutic activities;Therapeutic exercise;Ultrasound;Balance training;Neuromuscular re-education;Patient/family education;Orthotic Fit/Training;Manual techniques;Manual lymph drainage;Compression bandaging;Scar mobilization;Passive range of motion;Dry needling;Energy conservation;Taping;Splinting;Spinal Manipulations;Joint Manipulations    PT Next Visit Plan continue with quad strengthening, hip strengthening, and add gait training/balance. Continue machine strength as able, try SLS when  able.    PT Home Exercise Plan 4/6 LAQ; 06/02/20: bridge and SLR. sidestepping, sit to stand with t-loop, bridge with DF, prone quad stretch, calf stretch 4/19 SLR 4 way 4/25 heel raise, tandem stance; 4/27 step down, crossover step up, eccentric sit to stand 5/4 TKE, band knee curl    Consulted and Agree with Plan of Care Patient  Patient will benefit from skilled therapeutic intervention in order to improve the following deficits and impairments:  Abnormal gait,Decreased range of motion,Difficulty walking,Pain,Decreased endurance,Increased muscle spasms,Impaired perceived functional ability,Decreased activity tolerance,Decreased balance,Improper body mechanics,Impaired flexibility,Hypermobility,Decreased mobility,Decreased strength  Visit Diagnosis: Difficulty in walking, not elsewhere classified  Other abnormalities of gait and mobility  Other symptoms and signs involving the musculoskeletal system  Right knee pain, unspecified chronicity     Problem List Patient Active Problem List   Diagnosis Date Noted  . Encounter for gynecological examination with Papanicolaou smear of cervix 04/11/2017  . Elevated hemoglobin A1c 04/11/2017  . History of genital warts 04/11/2017  . Lumbar radiculopathy   . Impaired glucose tolerance 09/18/2016  . Acute pancreatitis 09/16/2016  . Epigastric abdominal pain   . Vitamin D deficiency 11/16/2015  . Dyslipidemia 11/16/2015  . Warts, genital 11/09/2014   11:01 AM, 06/29/20 Georges Lynch PT DPT  Physical Therapist with [place]  [facility]  [phone]   [facility] [address] [place], [place], [address] Phone: [phone]   Fax:  [phone]  Name: Gina Mccullough MRN: [id number] Date of Birth: November 29, 1975

## 2020-06-30 ENCOUNTER — Encounter (HOSPITAL_COMMUNITY): Payer: Self-pay | Admitting: Physical Therapy

## 2020-06-30 ENCOUNTER — Ambulatory Visit (HOSPITAL_COMMUNITY): Payer: Commercial Managed Care - PPO | Admitting: Physical Therapy

## 2020-06-30 DIAGNOSIS — R2689 Other abnormalities of gait and mobility: Secondary | ICD-10-CM

## 2020-06-30 DIAGNOSIS — M25561 Pain in right knee: Secondary | ICD-10-CM

## 2020-06-30 DIAGNOSIS — R262 Difficulty in walking, not elsewhere classified: Secondary | ICD-10-CM | POA: Diagnosis not present

## 2020-06-30 NOTE — Therapy (Addendum)
[place], [place], [address] Phone: [phone]   Fax:  [phone]  Physical Therapy Treatment  Patient Details  Name: Gina Mccullough MRN: [id number] Date of Birth: September 16, 1975 Referring Provider (PT): Larena Glassman MD   Encounter Date: 06/30/2020   PT End of Session - 06/30/20 0940    Visit Number 8    Number of Visits 12    Date for PT Re-Evaluation 07/13/20    Authorization Type [employer]  (visits 46 - medical review after, fax request to medical necessity request) (code [id number] - self care not included)    Authorization - Visit Number 8    Authorization - Number of Visits 20    Progress Note Due on Visit 10    PT Start Time 0921    PT Stop Time 1002    PT Time Calculation (min) 41 min    Activity Tolerance Patient tolerated treatment well    Behavior During Therapy [facility] for tasks assessed/performed           Past Medical History:  Diagnosis Date  . Dyslipidemia 11/16/2015  . Vaginal Pap smear, abnormal    hx HPV  . Vitamin D deficiency 11/16/2015  . Warts, genital     Past Surgical History:  Procedure Laterality Date  . CESAREAN SECTION  [id number]  . DILATION AND CURETTAGE OF UTERUS  2009  . ESSURE TUBAL LIGATION  2014    There were no vitals filed for this visit.   Subjective Assessment - 06/30/20 0924    Subjective PT states that she is not having any pain at this time.    Limitations Lifting;Standing;Sitting;House hold activities;Walking    Patient Stated Goals to get her knee to stop hurting and bone to stop moving    Currently in Pain? No/denies    Pain Onset More than a month ago    Pain Frequency Intermittent    Aggravating Factors  steps    Pain Relieving Factors rest    Effect of Pain on Daily Activities pushes thru the pain                             [facility] Adult PT Treatment/Exercise - 06/30/20 0001      Exercises   Exercises Knee/Hip      Knee/Hip Exercises:  Stretches   Gastroc Stretch 3 reps;30 seconds;Both    Gastroc Stretch Limitations slant board      Knee/Hip Exercises: Machines for Strengthening   Cybex Knee Flexion 49.5# 1 x 15 (focused eccentric)    Other Machine macine TKE RLE #30  x 15 (eccentric control)      Knee/Hip Exercises: Standing   Heel Raises Both;15 reps    Heel Raises Limitations no hands    Lateral Step Up Both;10 reps    Forward Step Up Both;10 reps;Hand Hold: 0;Step Height: 4"    Forward Step Up Limitations power up    Functional Squat 10 reps    SLS with Vectors 10"  3x    Other Standing Knee Exercises tandem stance on blue foam with head turns x 5; nods x 5      Knee/Hip Exercises: Seated   Sit to Sand 15 reps                    PT Short Term Goals - 06/30/20 1012      PT SHORT TERM GOAL #1  Title Patient will be independent with HEP in order to improve functional outcomes.    Time 3    Period Weeks    Status Achieved    Target Date 06/22/20      PT SHORT TERM GOAL #2   Title Patient will report at least 25% improvement in symptoms for improved quality of life.    Time 3    Period Weeks    Status Achieved    Target Date 06/22/20             PT Long Term Goals - 06/30/20 1013      PT LONG TERM GOAL #1   Title Patient will report at least 75% improvement in symptoms for improved quality of life.    Time 6    Period Weeks    Status Partially Met      PT LONG TERM GOAL #2   Title Patient will improve FOTO score by at least 10 points in order to indicate improved tolerance to activity.    Time 6    Period Weeks    Status On-going      PT LONG TERM GOAL #3   Title Patient will be able to complete 5x STS in under 11.4 seconds in order to demonstrate improving LE strength.    Time 6    Period Weeks    Status On-going      PT LONG TERM GOAL #4   Title Patient will be able to ambulate at least 400 feet in 2MWT in order to demonstrate improved gait speed for community ambulation.     Time 6    Period Weeks    Status On-going                 Plan - 06/30/20 0941    Clinical Impression Statement Added vector stance, head turns with tandum and power up to step up.  Attempted 6" with power step but this was to difficult for pt therefore reduced to 4" step.    Personal Factors and Comorbidities Comorbidity 1;Age;Fitness;Past/Current Experience;Behavior Pattern;Profession;Time since onset of injury/illness/exacerbation    Comorbidities increased BMI    Examination-Activity Limitations Locomotion Level;Transfers;Bend;Sit;Sleep;Squat;Stairs;Stand;Lift    Examination-Participation Restrictions Cleaning;Occupation;Community Activity;Volunteer;Yard Work;Shop    Stability/Clinical Decision Making Stable/Uncomplicated    Rehab Potential Good    PT Frequency 2x / week    PT Duration 6 weeks    PT Treatment/Interventions ADLs/Self Care Home Management;Aquatic Therapy;Cryotherapy;Electrical Stimulation;Iontophoresis 29m/ml Dexamethasone;Moist Heat;Traction;DME Instruction;Gait training;Stair training;Functional mobility training;Therapeutic activities;Therapeutic exercise;Ultrasound;Balance training;Neuromuscular re-education;Patient/family education;Orthotic Fit/Training;Manual techniques;Manual lymph drainage;Compression bandaging;Scar mobilization;Passive range of motion;Dry needling;Energy conservation;Taping;Splinting;Spinal Manipulations;Joint Manipulations    PT Next Visit Plan continue with quad strengthening, hip strengthening, and add gait training/balance. Continue machine strength as able,    PT Home Exercise Plan 4/6 LAQ; 06/02/20: bridge and SLR. sidestepping, sit to stand with t-loop, bridge with DF, prone quad stretch, calf stretch 4/19 SLR 4 way 4/25 heel raise, tandem stance; 4/27 step down, crossover step up, eccentric sit to stand 5/4 TKE, band knee curl    Consulted and Agree with Plan of Care Patient           Patient will benefit from skilled therapeutic  intervention in order to improve the following deficits and impairments:  Abnormal gait,Decreased range of motion,Difficulty walking,Pain,Decreased endurance,Increased muscle spasms,Impaired perceived functional ability,Decreased activity tolerance,Decreased balance,Improper body mechanics,Impaired flexibility,Hypermobility,Decreased mobility,Decreased strength  Visit Diagnosis: Other abnormalities of gait and mobility  Acute pain of right knee  Difficulty in walking, not elsewhere classified  Problem List Patient Active Problem List   Diagnosis Date Noted  . Encounter for gynecological examination with Papanicolaou smear of cervix 04/11/2017  . Elevated hemoglobin A1c 04/11/2017  . History of genital warts 04/11/2017  . Lumbar radiculopathy   . Impaired glucose tolerance 09/18/2016  . Acute pancreatitis 09/16/2016  . Epigastric abdominal pain   . Vitamin D deficiency 11/16/2015  . Dyslipidemia 11/16/2015  . Warts, genital 11/09/2014   Rayetta Humphrey, PT CLT [phone] 06/30/2020, 10:15 AM  [facility] [address] [place], [place], [address] Phone: [phone]   Fax:  [phone]  Name: Navie Lamoreaux MRN: [id number] Date of Birth: 1975-12-27

## 2020-07-04 ENCOUNTER — Ambulatory Visit (HOSPITAL_COMMUNITY): Payer: Commercial Managed Care - PPO | Admitting: Physical Therapy

## 2020-07-06 ENCOUNTER — Ambulatory Visit (HOSPITAL_COMMUNITY): Payer: Commercial Managed Care - PPO

## 2020-08-16 ENCOUNTER — Encounter (HOSPITAL_COMMUNITY): Payer: Self-pay | Admitting: Physical Therapy

## 2020-08-16 NOTE — Therapy (Signed)
[place], [place], [address] Phone: [phone]   Fax:  [phone]  Patient Details  Name: Gina Mccullough MRN: [id number] Date of Birth: [birth year] Referring Provider:  No ref. provider found  Encounter Date: 08/16/2020   PHYSICAL THERAPY DISCHARGE SUMMARY  Visits from Start of Care: 8  Current functional level related to goals / functional outcomes: Patient has met 2/2 short term goals during time attending PT. Unknown current and unable to reassess as the patient has not returned.   Remaining deficits: Unknown as the patient has not returned.   Education / Equipment: HEP   Patient agrees to discharge. Patient goals were partially met. Patient is being discharged due to not returning since the last visit.  10:06 AM, 08/16/20 Mearl Latin PT, DPT Physical Therapist at [facility], [place], [address] Phone: [phone]   Fax:  [phone]

## 2020-09-09 ENCOUNTER — Telehealth: Payer: Self-pay | Admitting: Orthopedic Surgery

## 2020-09-09 NOTE — Telephone Encounter (Signed)
Patient called left message her knee is still giving her trouble.    She wants to know how much it is for the injections.  She was told at her last visit she might need gel injections.   Can you please call her back at [phone] to discuss the price and how the injections work.   Thanks

## 2020-09-09 NOTE — Telephone Encounter (Signed)
Will you please assist patient. Thank you.

## 2020-09-13 NOTE — Telephone Encounter (Signed)
I called patient and she has a new insurance through [employer].  She will bring card by so we can scan into system and I will then check benefits for her and let her know an estimated cost.

## 2020-09-15 NOTE — Telephone Encounter (Signed)
Submitted online OrthogenRx for [employer].  Patient declines to proceed with Orthovisc due to cost.

## 2020-09-22 ENCOUNTER — Telehealth: Payer: Self-pay | Admitting: Radiology

## 2020-09-22 NOTE — Telephone Encounter (Signed)
Patients Tri Visc is in closet behind my desk, it came in today   Can you schedule appointments for 3 trivisc injections one week apart  Patient purchased the meds, so injection charges only, no J code

## 2020-09-22 NOTE — Telephone Encounter (Signed)
I called patient to offer the series of 3 appointments per note; reached voice mail, left message to return call.

## 2020-09-27 NOTE — Telephone Encounter (Signed)
Pt injections have arrived and appointments scheduled.

## 2020-09-28 NOTE — Telephone Encounter (Signed)
I called patient and advised she would owe 30% of allowable, unless all goes to deductible, she will call ins to inquire further on CPT 20610 for admin of injections.

## 2020-10-14 ENCOUNTER — Other Ambulatory Visit: Payer: Self-pay

## 2020-10-14 ENCOUNTER — Encounter: Payer: Self-pay | Admitting: Orthopedic Surgery

## 2020-10-14 ENCOUNTER — Ambulatory Visit (INDEPENDENT_AMBULATORY_CARE_PROVIDER_SITE_OTHER): Payer: BLUE CROSS/BLUE SHIELD | Admitting: Orthopedic Surgery

## 2020-10-14 DIAGNOSIS — M1711 Unilateral primary osteoarthritis, right knee: Secondary | ICD-10-CM

## 2020-10-14 NOTE — Patient Instructions (Signed)
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  Occasionally, you can have some pain at the injection site, this is normal.  You can place ice at the injection site, or take over-the-counter medications such as Tylenol (acetaminophen) or Advil (ibuprofen).  Please follow all directions listed on the bottle.  If your joint (knee or shoulder) becomes swollen, red or very painful, please contact the clinic for additional assistance.

## 2020-10-14 NOTE — Progress Notes (Signed)
Orthopaedic Clinic Return  Assessment: Gina Mccullough is a 45 y.o. female with the following: Right knee pain; mild-moderate arthritis, particularly within the patellofemoral joint.  Plan: Patient has ongoing right knee pain.  She has previously tried steroid injection, limited improvement in her symptoms.  Pain is occasional.  She presents today for her first Trivisc (HA) injection.  Patient's vitals medication.  Procedure note injection Right knee joint   Verbal consent was obtained to inject the right knee joint  Timeout was completed to confirm the site of injection.  The skin was prepped with alcohol and ethyl chloride was sprayed at the injection site.  A 21-gauge needle was used to inject Trivisc (HA viscosupplementaiton) into the right knee using an anterolateral approach.  There were no complications. A sterile bandage was applied.    This patient is diagnosed with osteoarthritis of the knee(s).    Radiographs show evidence of joint space narrowing, osteophytes, subchondral sclerosis and/or subchondral cysts.  This patient has knee pain which interferes with functional and activities of daily living.    This patient has experienced inadequate response, adverse effects and/or intolerance with conservative treatments such as acetaminophen, NSAIDS, topical creams, physical therapy or regular exercise, knee bracing and/or weight loss.   This patient has experienced inadequate response or has a contraindication to intra articular steroid injections for at least 3 months.   This patient is not scheduled to have a total knee replacement within 6 months of starting treatment with viscosupplementation.    Follow-up: Return in about 1 week (around 10/21/2020).   Subjective:  Chief Complaint  Patient presents with   Knee Pain    Chronic//right knee//Trivisc #1   Injections    RIGHT KNEE//TRIVISC #1//pt needs a note stating she needs to use elevator at work    History of Present  Illness: Gina Mccullough is a 45 y.o. female who returns to clinic for repeat evaluation of her right knee.  She previously had steroid injections, without sustained relief.  She continues to use a brace on her right knee.  She has difficulty with stairs.  Pain is in the anterior and lateral aspect of the knee.  She notes occasional swelling in the knee.    Review of Systems: No fevers or chills No numbness or tingling No chest pain No shortness of breath No bowel or bladder dysfunction No GI distress No headaches   Objective: There were no vitals taken for this visit.  Physical Exam:  And oriented.  No acute distress.  Mild right knee effusion.  Crepitus with range of motion testing.  Pain with patellar grind.  Tenderness palpation of the lateral joint line.  She has near full range of motion.  Negative Lachman.  IMAGING: I personally ordered and reviewed the following images:  No new imaging obtained today.   Oliver Barre, MD 10/14/2020 1:22 PM

## 2020-10-21 ENCOUNTER — Encounter: Payer: Self-pay | Admitting: Orthopedic Surgery

## 2020-10-21 ENCOUNTER — Other Ambulatory Visit: Payer: Self-pay

## 2020-10-21 ENCOUNTER — Ambulatory Visit (INDEPENDENT_AMBULATORY_CARE_PROVIDER_SITE_OTHER): Payer: BLUE CROSS/BLUE SHIELD | Admitting: Orthopedic Surgery

## 2020-10-21 DIAGNOSIS — M1711 Unilateral primary osteoarthritis, right knee: Secondary | ICD-10-CM

## 2020-10-21 NOTE — Progress Notes (Signed)
Orthopaedic Clinic Return  Assessment: Gina Mccullough is a 45 y.o. female with the following: Right knee pain; mild-moderate arthritis, particularly within the patellofemoral joint.  Plan: No change in her symptoms following 1st HA injection.  Repeat 2nd today.  Call clinic if worsening pain, swelling, redness. Follow up in 1 week  Procedure note injection Right knee joint   Verbal consent was obtained to inject the right knee joint  Timeout was completed to confirm the site of injection.  The skin was prepped with alcohol and ethyl chloride was sprayed at the injection site.  A 21-gauge needle was used to inject Trivisc (HA viscosupplementaiton) into the right knee using an anterolateral approach.  There were no complications. A sterile bandage was applied.    This patient is diagnosed with osteoarthritis of the knee(s).    Radiographs show evidence of joint space narrowing, osteophytes, subchondral sclerosis and/or subchondral cysts.  This patient has knee pain which interferes with functional and activities of daily living.    This patient has experienced inadequate response, adverse effects and/or intolerance with conservative treatments such as acetaminophen, NSAIDS, topical creams, physical therapy or regular exercise, knee bracing and/or weight loss.   This patient has experienced inadequate response or has a contraindication to intra articular steroid injections for at least 3 months.   This patient is not scheduled to have a total knee replacement within 6 months of starting treatment with viscosupplementation.    Follow-up: Return in about 1 week (around 10/28/2020).   Subjective:  Chief Complaint  Patient presents with   Injections    2nd Trivisc right knee lot R-3    History of Present Illness: Gina Mccullough is a 45 y.o. female who returns to clinic for repeat evaluation of her right knee.  She had her first HA injection a week ago.  No problems with the injection.   Pain is about the same.  No difference following 1st injection.  Ready to proceed with injection today.    Review of Systems: No fevers or chills No numbness or tingling No chest pain No shortness of breath No bowel or bladder dysfunction No GI distress No headaches   Objective: There were no vitals taken for this visit.  Physical Exam:  And oriented.  No acute distress.  Mild right knee effusion.  Crepitus with range of motion testing.  Pain with patellar grind.  Tenderness palpation of the lateral joint line.  She has near full range of motion.  Negative Lachman.  IMAGING: I personally ordered and reviewed the following images:  No new imaging obtained today.   Oliver Barre, MD 10/21/2020 9:54 AM

## 2020-10-28 ENCOUNTER — Ambulatory Visit (INDEPENDENT_AMBULATORY_CARE_PROVIDER_SITE_OTHER): Payer: BLUE CROSS/BLUE SHIELD | Admitting: Orthopedic Surgery

## 2020-10-28 ENCOUNTER — Encounter: Payer: Self-pay | Admitting: Orthopedic Surgery

## 2020-10-28 ENCOUNTER — Other Ambulatory Visit: Payer: Self-pay

## 2020-10-28 DIAGNOSIS — M1711 Unilateral primary osteoarthritis, right knee: Secondary | ICD-10-CM

## 2020-10-28 NOTE — Patient Instructions (Signed)

## 2020-10-28 NOTE — Progress Notes (Signed)
Orthopaedic Clinic Return  Assessment: Gina Mccullough is a 45 y.o. female with the following: Right knee pain; mild-moderate arthritis, particularly within the patellofemoral joint.  Plan: No change in her symptoms following 2nd HA injection.  Repeat 3rd today.  Call clinic if worsening pain, swelling, redness. Follow up as needed.  Procedure note injection Right knee joint   Verbal consent was obtained to inject the right knee joint  Timeout was completed to confirm the site of injection.  The skin was prepped with alcohol and ethyl chloride was sprayed at the injection site.  A 21-gauge needle was used to inject Trivisc (HA viscosupplementaiton) into the right knee using an anterolateral approach.  There were no complications. A sterile bandage was applied.    This patient is diagnosed with osteoarthritis of the knee(s).    Radiographs show evidence of joint space narrowing, osteophytes, subchondral sclerosis and/or subchondral cysts.  This patient has knee pain which interferes with functional and activities of daily living.    This patient has experienced inadequate response, adverse effects and/or intolerance with conservative treatments such as acetaminophen, NSAIDS, topical creams, physical therapy or regular exercise, knee bracing and/or weight loss.   This patient has experienced inadequate response or has a contraindication to intra articular steroid injections for at least 3 months.   This patient is not scheduled to have a total knee replacement within 6 months of starting treatment with viscosupplementation.    Follow-up: Return if symptoms worsen or fail to improve.   Subjective:  Chief Complaint  Patient presents with   Knee Pain   Injections    Right knee 3rd injection    History of Present Illness: Gina Mccullough is a 45 y.o. female who returns to clinic for repeat evaluation of her right knee.  No problems with the injection.  Pain is about the same.  No  difference following 1st and 2nd injection.  Ready to proceed with injection today.    Review of Systems: No fevers or chills No numbness or tingling No chest pain No shortness of breath No bowel or bladder dysfunction No GI distress No headaches   Objective: There were no vitals taken for this visit.  Physical Exam:  And oriented.  No acute distress.  Mild right knee effusion.  Crepitus with range of motion testing.  Pain with patellar grind.  Tenderness palpation of the lateral joint line.  She has near full range of motion.  Negative Lachman.  IMAGING: I personally ordered and reviewed the following images:  No new imaging obtained today.   Oliver Barre, MD 10/28/2020 10:17 PM

## 2020-11-02 ENCOUNTER — Ambulatory Visit: Payer: Commercial Managed Care - PPO | Admitting: Orthopedic Surgery

## 2021-10-27 ENCOUNTER — Ambulatory Visit: Payer: Commercial Managed Care - PPO | Admitting: Nurse Practitioner

## 2021-10-27 ENCOUNTER — Encounter: Payer: Self-pay | Admitting: Nurse Practitioner

## 2021-10-27 VITALS — BP 143/95 | HR 88 | Temp 97.4°F | Resp 20 | Ht 63.0 in | Wt 250.0 lb

## 2021-10-27 DIAGNOSIS — N898 Other specified noninflammatory disorders of vagina: Secondary | ICD-10-CM | POA: Diagnosis not present

## 2021-10-27 DIAGNOSIS — Z0001 Encounter for general adult medical examination with abnormal findings: Secondary | ICD-10-CM | POA: Diagnosis not present

## 2021-10-27 DIAGNOSIS — Z0000 Encounter for general adult medical examination without abnormal findings: Secondary | ICD-10-CM

## 2021-10-27 DIAGNOSIS — Z202 Contact with and (suspected) exposure to infections with a predominantly sexual mode of transmission: Secondary | ICD-10-CM

## 2021-10-27 LAB — WET PREP FOR TRICH, YEAST, CLUE
Clue Cell Exam: NEGATIVE
Trichomonas Exam: NEGATIVE
Yeast Exam: NEGATIVE

## 2021-10-27 NOTE — Progress Notes (Signed)
New Patient Note  RE: Gina Mccullough MRN: [id number] DOB: 04-18-1975 Date of Office Visit: 10/27/2021  Chief Complaint: Establish Care  History of Present Illness: .   Encounter for general adult medical examination Physical: Patient's last physical exam was 3 year ago .  Weight: Not appropriate for height (BMI greater than than 27%) ;  Blood Pressure: Normal (BP  greater than 120/80) ;  Medical History: Patient history reviewed ; Family history reviewed ;  Allergies Reviewed: No change in current allergies ;  Medications Reviewed: Medications reviewed - no changes ;  Lipids: Normal lipid levels labs completed results pending Smoking: Life-long non-smoker ;  Physical Activity: Exercises at least 3 times per week ;  Alcohol/Drug Use: Is a non-drinker ; No illicit drug use  Patient is not afflicted from Stress Incontinence and Urge Incontinence  Safety: reviewed ; Patient wears a seat belt, has smoke detectors, has carbon monoxide detectors, practices appropriate gun safety, and wears sunscreen with extended sun exposure.No  Dental Care: annual cleanings, brushes and flosses daily. Ophthalmology/Optometry: Annual visit.  Hearing loss: none Vision impairments: none   Vaginal Discharge The patient's primary symptoms include vaginal discharge. The patient's pertinent negatives include no genital itching, genital lesions, genital odor or pelvic pain. This is a new problem. The current episode started in the past 7 days. The problem occurs constantly. The patient is experiencing no pain. Pertinent negatives include no abdominal pain, chills, dysuria, fever, flank pain, frequency or hematuria. The vaginal discharge was copious and malodorous. There has been no bleeding. Nothing aggravates the symptoms. She has tried nothing for the symptoms. She is sexually active.     Assessment and Plan: Gina Mccullough is a 46 y.o. female establishing care with concerns of vaginal discharge, malodorous and dysuria.   Completed wet prep, STI testing.  Results pending. Completed head to toe assessment for annual physical.  Education provided on health maintenance and preventative care.  Patient is to follow-up in 1 year for annual physical exams.  Printed handouts given.  Patient is not ready for mammograms at this time. We will complete Pap on another visit.   Concerning patient's elevated blood pressure, patient reports blood pressure values are better at home.  Advised patient to take blood pressure log daily for 7 days and send values to clinic.  Patient has a history of pregnancy induced hypertension and was treated but currently is not on any blood pressure medication.  Return if symptoms worsen or fail to improve.   Diagnostics:   Past Medical History: Patient Active Problem List   Diagnosis Date Noted   Encounter for gynecological examination with Papanicolaou smear of cervix 04/11/2017   Elevated hemoglobin A1c 04/11/2017   History of genital warts 04/11/2017   Lumbar radiculopathy    Impaired glucose tolerance 09/18/2016   Acute pancreatitis 09/16/2016   Epigastric abdominal pain    Vitamin D deficiency 11/16/2015   Dyslipidemia 11/16/2015   Warts, genital 11/09/2014   Past Medical History:  Diagnosis Date   Dyslipidemia 11/16/2015   Vaginal Pap smear, abnormal    hx HPV   Vitamin D deficiency 11/16/2015   Warts, genital    Past Surgical History: Past Surgical History:  Procedure Laterality Date   CESAREAN SECTION  2014,2002   DILATION AND CURETTAGE OF UTERUS  2009   ESSURE TUBAL LIGATION  2014   Medication List:  Current Outpatient Medications  Medication Sig Dispense Refill   aspirin EC 81 MG tablet Take 81 mg by mouth daily. Swallow  whole.     vitamin B-12 (CYANOCOBALAMIN) 100 MCG tablet Take 100 mcg by mouth daily.     ibuprofen (ADVIL) 200 MG tablet Take 200 mg by mouth every 6 (six) hours as needed. (Patient not taking: Reported on 10/27/2021)     No current  facility-administered medications for this visit.   Allergies: No Known Allergies Social History: Social History   Socioeconomic History   Marital status: Married    Spouse name: Not on file   Number of children: Not on file   Years of education: Not on file   Highest education level: Not on file  Occupational History   Not on file  Tobacco Use   Smoking status: Never   Smokeless tobacco: Never  Vaping Use   Vaping Use: Never used  Substance and Sexual Activity   Alcohol use: No   Drug use: No   Sexual activity: Yes    Birth control/protection: Surgical    Comment: ESSURE  Other Topics Concern   Not on file  Social History Narrative   Not on file   Social Determinants of Health   Financial Resource Strain: Not on file  Food Insecurity: Not on file  Transportation Needs: Not on file  Physical Activity: Not on file  Stress: Not on file  Social Connections: Not on file       Family History: Family History  Problem Relation Age of Onset   Heart disease Mother    Stroke Mother    Diabetes Brother          Review of Systems  Constitutional: Negative.  Negative for chills, fatigue and fever.  HENT: Negative.    Eyes: Negative.   Respiratory: Negative.    Cardiovascular: Negative.   Gastrointestinal: Negative.  Negative for abdominal pain.  Genitourinary:  Positive for vaginal discharge. Negative for dysuria, flank pain, frequency, hematuria, pelvic pain and vaginal pain.  Musculoskeletal: Negative.   Skin: Negative.   Neurological: Negative.   Psychiatric/Behavioral: Negative.    All other systems reviewed and are negative.  Objective: BP (!) 143/95   Pulse 88   Temp (!) 97.4 F (36.3 C) (Temporal)   Resp 20   Ht 5\' 3"  (1.6 m)   Wt 250 lb (113.4 kg)   SpO2 99%   BMI 44.29 kg/m  Body mass index is 44.29 kg/m. Physical Exam Vitals and nursing note reviewed.  Constitutional:      Appearance: Normal appearance. She is obese.  HENT:     Right  Ear: External ear normal.     Left Ear: External ear normal.     Nose: Nose normal. No congestion.  Eyes:     Conjunctiva/sclera: Conjunctivae normal.     Pupils: Pupils are equal, round, and reactive to light.  Cardiovascular:     Rate and Rhythm: Normal rate and regular rhythm.     Pulses: Normal pulses.     Heart sounds: Normal heart sounds.  Pulmonary:     Effort: Pulmonary effort is normal.     Breath sounds: Normal breath sounds.  Abdominal:     General: Bowel sounds are normal.  Skin:    General: Skin is warm.     Findings: No erythema or rash.  Neurological:     General: No focal deficit present.     Mental Status: She is alert and oriented to person, place, and time.  Psychiatric:        Behavior: Behavior normal.    The plan was reviewed  with the patient/family, and all questions/concerned were addressed.  It was my pleasure to see Gina Mccullough today and participate in her care. Please feel free to contact me with any questions or concerns.  Sincerely,  Lynnell Chad NP [facility]

## 2021-10-27 NOTE — Patient Instructions (Signed)

## 2021-10-28 LAB — CMP14+EGFR
ALT: 22 IU/L (ref 0–32)
AST: 21 IU/L (ref 0–40)
Albumin/Globulin Ratio: 1.1 — ABNORMAL LOW (ref 1.2–2.2)
Albumin: 3.9 g/dL (ref 3.9–4.9)
Alkaline Phosphatase: 105 IU/L (ref 44–121)
BUN/Creatinine Ratio: 17 (ref 9–23)
BUN: 11 mg/dL (ref 6–24)
Bilirubin Total: 0.5 mg/dL (ref 0.0–1.2)
CO2: 23 mmol/L (ref 20–29)
Calcium: 9.2 mg/dL (ref 8.7–10.2)
Chloride: 105 mmol/L (ref 96–106)
Creatinine, Ser: 0.64 mg/dL (ref 0.57–1.00)
Globulin, Total: 3.5 g/dL (ref 1.5–4.5)
Glucose: 113 mg/dL — ABNORMAL HIGH (ref 70–99)
Potassium: 4.2 mmol/L (ref 3.5–5.2)
Sodium: 139 mmol/L (ref 134–144)
Total Protein: 7.4 g/dL (ref 6.0–8.5)
eGFR: 112 mL/min/{1.73_m2} (ref >59)

## 2021-10-28 LAB — CBC WITH DIFFERENTIAL/PLATELET
Basophils Absolute: 0 10*3/uL (ref 0.0–0.2)
Basos: 0 %
EOS (ABSOLUTE): 0.1 10*3/uL (ref 0.0–0.4)
Eos: 2 %
Hematocrit: 34.5 % (ref 34.0–46.6)
Hemoglobin: 10.9 g/dL — ABNORMAL LOW (ref 11.1–15.9)
Immature Grans (Abs): 0 10*3/uL (ref 0.0–0.1)
Immature Granulocytes: 0 %
Lymphocytes Absolute: 1.7 10*3/uL (ref 0.7–3.1)
Lymphs: 30 %
MCH: 25.8 pg — ABNORMAL LOW (ref 26.6–33.0)
MCHC: 31.6 g/dL (ref 31.5–35.7)
MCV: 82 fL (ref 79–97)
Monocytes Absolute: 0.5 10*3/uL (ref 0.1–0.9)
Monocytes: 9 %
Neutrophils Absolute: 3.5 10*3/uL (ref 1.4–7.0)
Neutrophils: 59 %
Platelets: 373 10*3/uL (ref 150–450)
RBC: 4.22 x10E6/uL (ref 3.77–5.28)
RDW: 14.5 % (ref 11.7–15.4)
WBC: 5.9 10*3/uL (ref 3.4–10.8)

## 2021-10-28 LAB — LIPID PANEL
Chol/HDL Ratio: 4.9 ratio — ABNORMAL HIGH (ref 0.0–4.4)
Cholesterol, Total: 172 mg/dL (ref 100–199)
HDL: 35 mg/dL — ABNORMAL LOW (ref >39)
LDL Chol Calc (NIH): 118 mg/dL — ABNORMAL HIGH (ref 0–99)
Triglycerides: 105 mg/dL (ref 0–149)
VLDL Cholesterol Cal: 19 mg/dL (ref 5–40)

## 2021-10-31 LAB — CT, NG, MYCOPLASMAS NAA, URINE
Chlamydia trachomatis, NAA: NEGATIVE
Mycoplasma genitalium NAA: NEGATIVE
Mycoplasma hominis NAA: NEGATIVE
Neisseria gonorrhoeae, NAA: NEGATIVE
Ureaplasma spp NAA: POSITIVE — AB

## 2021-11-01 ENCOUNTER — Other Ambulatory Visit: Payer: Self-pay | Admitting: Nurse Practitioner

## 2021-11-01 DIAGNOSIS — Z2239 Carrier of other specified bacterial diseases: Secondary | ICD-10-CM

## 2021-11-01 MED ORDER — AZITHROMYCIN 250 MG PO TABS: ORAL_TABLET | ORAL | 0 refills | Status: AC

## 2021-11-14 ENCOUNTER — Other Ambulatory Visit (HOSPITAL_COMMUNITY): Payer: Self-pay | Admitting: *Deleted

## 2021-11-14 DIAGNOSIS — Z1231 Encounter for screening mammogram for malignant neoplasm of breast: Secondary | ICD-10-CM

## 2021-11-16 LAB — HM PAP SMEAR: HPV, high-risk: NEGATIVE

## 2021-12-15 ENCOUNTER — Encounter: Payer: Commercial Managed Care - PPO | Admitting: Nurse Practitioner

## 2022-04-26 ENCOUNTER — Encounter: Payer: Self-pay | Admitting: Radiology

## 2022-08-23 ENCOUNTER — Other Ambulatory Visit: Payer: Self-pay

## 2022-08-23 ENCOUNTER — Encounter (HOSPITAL_COMMUNITY): Payer: Self-pay | Admitting: Emergency Medicine

## 2022-08-23 ENCOUNTER — Emergency Department (HOSPITAL_COMMUNITY)
Admission: EM | Admit: 2022-08-23 | Discharge: 2022-08-23 | Disposition: A | Discharge status: Home / Self Care | Payer: Commercial Managed Care - PPO | Attending: Emergency Medicine | Admitting: Emergency Medicine

## 2022-08-23 DIAGNOSIS — Y9301 Activity, walking, marching and hiking: Secondary | ICD-10-CM | POA: Diagnosis not present

## 2022-08-23 DIAGNOSIS — S79922A Unspecified injury of left thigh, initial encounter: Secondary | ICD-10-CM | POA: Diagnosis present

## 2022-08-23 DIAGNOSIS — S76312A Strain of muscle, fascia and tendon of the posterior muscle group at thigh level, left thigh, initial encounter: Secondary | ICD-10-CM | POA: Insufficient documentation

## 2022-08-23 DIAGNOSIS — Y92512 Supermarket, store or market as the place of occurrence of the external cause: Secondary | ICD-10-CM | POA: Diagnosis not present

## 2022-08-23 DIAGNOSIS — Z7982 Long term (current) use of aspirin: Secondary | ICD-10-CM | POA: Diagnosis not present

## 2022-08-23 DIAGNOSIS — W010XXA Fall on same level from slipping, tripping and stumbling without subsequent striking against object, initial encounter: Secondary | ICD-10-CM | POA: Insufficient documentation

## 2022-08-23 MED ORDER — IBUPROFEN 400 MG PO TABS
600.0000 mg | ORAL_TABLET | Freq: Once | ORAL | Status: AC
  Administered 2022-08-23: 600 mg via ORAL
  Filled 2022-08-23: qty 2

## 2022-08-23 MED ORDER — NAPROXEN 500 MG PO TABS: 500.0000 mg | ORAL_TABLET | Freq: Two times a day (BID) | ORAL | 0 refills | Status: AC

## 2022-08-23 MED ORDER — LIDOCAINE 5 % EX PTCH: 1.0000 | MEDICATED_PATCH | CUTANEOUS | 0 refills | Status: AC

## 2022-08-23 MED ORDER — METHOCARBAMOL 500 MG PO TABS: 500.0000 mg | ORAL_TABLET | Freq: Four times a day (QID) | ORAL | 0 refills | Status: AC | PRN

## 2022-08-23 NOTE — ED Triage Notes (Signed)
Pt presents after a fall outside a local business; she states her foot slipped on a ground surface and her feet went in opposite directions, landing her in a split with pain to the back of her left thigh from knee to hip. Pain rated 2/10 at rest and 9/10 when walking.

## 2022-08-23 NOTE — ED Provider Notes (Signed)
Gina Mccullough EMERGENCY DEPARTMENT AT [facility] Provider Note   CSN: [id number] Arrival date & time: 08/23/22  1054     History  Chief Complaint  Patient presents with   Marletta Lor    Gina Mccullough is a 47 y.o. female.  She denies any chronic medical conditions, she c/o L posterior medial thigh pain.  She states this occurred when she was walking into [employer] and slipped on the floor and "did a split".  She had immediate pain in her left thigh, denies pain to the hip or knee joint or bony pain, she able to ambulate but has pain when changing positions.  Denies any numbness or tingling, no swelling or bruising.  Denies head injury or loss of consciousness.   Fall       Home Medications Prior to Admission medications   Medication Sig Start Date End Date Taking? Authorizing Provider  lidocaine (LIDODERM) 5 % Place 1 patch onto the skin daily. Remove & Discard patch within 12 hours or as directed by MD 08/23/22  Yes Cristi Loron, Mcadoo Muzquiz A, PA-C  methocarbamol (ROBAXIN) 500 MG tablet Take 1 tablet (500 mg total) by mouth every 6 (six) hours as needed for muscle spasms. 08/23/22  Yes Semaya Vida A, PA-C  naproxen (NAPROSYN) 500 MG tablet Take 1 tablet (500 mg total) by mouth 2 (two) times daily. 08/23/22  Yes Lether Tesch A, PA-C  aspirin EC 81 MG tablet Take 81 mg by mouth daily. Swallow whole.    [provider]  ibuprofen (ADVIL) 200 MG tablet Take 200 mg by mouth every 6 (six) hours as needed. Patient not taking: Reported on 10/27/2021    [provider]  vitamin B-12 (CYANOCOBALAMIN) 100 MCG tablet Take 100 mcg by mouth daily.    [provider]      Allergies    Patient has no known allergies.    Review of Systems   Review of Systems  Physical Exam Updated Vital Signs BP (!) 139/117 (BP Location: Right Arm)   Pulse 88   Temp 98.7 F (37.1 C) (Oral)   Resp 16   Ht 5\' 3"  (1.6 m)   Wt 106.6 kg   LMP 08/16/2022 (Approximate)   SpO2 99%   BMI  41.63 kg/m  Physical Exam Vitals and nursing note reviewed.  Constitutional:      General: She is not in acute distress.    Appearance: She is well-developed.  HENT:     Head: Normocephalic and atraumatic.  Eyes:     Conjunctiva/sclera: Conjunctivae normal.  Cardiovascular:     Rate and Rhythm: Normal rate and regular rhythm.     Heart sounds: No murmur heard. Pulmonary:     Effort: Pulmonary effort is normal. No respiratory distress.     Breath sounds: Normal breath sounds.  Abdominal:     Palpations: Abdomen is soft.     Tenderness: There is no abdominal tenderness.  Musculoskeletal:        General: No swelling.     Cervical back: Neck supple.     Comments: Is tenderness to palpation to the left posterior medial thigh along the hamstring muscle, no bruising or swelling, no hematoma is palpated.  DP and PT pulses in the left foot are intact.  Skin:    General: Skin is warm and dry.     Capillary Refill: Capillary refill takes less than 2 seconds.  Neurological:     General: No focal deficit present.     Mental  Status: She is alert and oriented to person, place, and time.  Psychiatric:        Mood and Affect: Mood normal.     ED Results / Procedures / Treatments   Labs (all labs ordered are listed, but only abnormal results are displayed) Labs Reviewed - No data to display  EKG None  Radiology No results found.  Procedures Procedures    Medications Ordered in ED Medications  ibuprofen (ADVIL) tablet 600 mg (has no administration in time range)    ED Course/ Medical Decision Making/ A&P                             Medical Decision Making Ddx: fx, sprain, strain, contusion ED course: Is a 47 year old female with no chronic medical conditions who presents to the ER complaining of left medial thigh pain after "doing a split" accidentally when she slipped on the floor at [facility].  No other injury or trauma, no bony tenderness on exam, she can bear full weight  and ambulate.  States it feels like a pulled muscle, hurst history and exam are consistent with this.  Patient does not feel that she needs any x-rays and I am inclined to agree if she has no bony tenderness can bear full weight and ambulate without any difficulty.  There is no neurovascular compromise, no hematoma or bruising.  Discussed conservative care, she was given a work note to use as needed but states she plans to go to work.  Had no head injury or loss of consciousness with no neck or back pain.  Advised on follow-up and return precautions.  Advised to not work or drive while taking the muscle relaxers.  She states she does not currently have a PCP so was given a list to follow-up with.  States her blood pressure has been historically "borderline" it is elevated today but she is asymptomatic of this may be due to her pain.  She is going to follow-up with PCP for this as well.  Risk Prescription drug management.           Final Clinical Impression(s) / ED Diagnoses Final diagnoses:  Hamstring strain, left, initial encounter    Rx / DC Orders ED Discharge Orders          Ordered    lidocaine (LIDODERM) 5 %  Every 24 hours        08/23/22 1258    methocarbamol (ROBAXIN) 500 MG tablet  Every 6 hours PRN        08/23/22 1258    naproxen (NAPROSYN) 500 MG tablet  2 times daily        08/23/22 [address], PA-C 08/23/22 1308    Vanetta Mulders, MD 08/25/22 1405

## 2022-08-23 NOTE — Discharge Instructions (Addendum)
Was a pleasure taking care of you today.  Based on your exam I feel you most likely strained the hamstring muscle.  These can take weeks to fully heal.  Is important to rest and to activity as tolerated.  You are prescribed topical lidocaine patches and naproxen for pain and a muscle relaxer called methocarbamol.  Do not Take the muscle laxer fever and be working or driving.  Portly follow-up with your primary care doctor.  You may benefit from physical therapy.  [facility] Primary Care Doctor List    Syliva Overman, MD. Specialty: [facility] Contact information: [address]  [place] [address]  [phone]   Lilyan Punt, MD. Specialty: [facility] Contact information: [address]  [place] [address]  [phone]   Avon Gully, MD Specialty: Internal Medicine Contact information: [address] [place] [address]  [phone]   Catalina Pizza, MD. Specialty: Internal Medicine Contact information: [address]  [place] [address]  [phone]    [facility] (Dr. Selena Batten) Specialty: Family Medicine Contact information: [address]  [place] [address]  [phone]   John Giovanni, MD. Specialty: [facility] Contact information: [address]  [address]  [place] [address]  [phone]   Carylon Perches, MD. Specialty: Internal Medicine Contact information: [address]  [address]  [place] [address]  [phone]    [facility]  [address] [place], [place] [address] [phone]  Services [facility] offers a variety of basic health services.  Services include but are not limited to: Blood pressure checks  Heart rate checks  Blood sugar checks  Urine analysis  Rapid strep tests  Pregnancy tests.  Health education and referrals  People needing more complex services will be directed to a physician  online. Using these virtual visits, doctors can evaluate and prescribe medicine and treatments. There will be no medication on-site, though [facility] will help patients fill their prescriptions at little to no cost.   For More information please go to: [email or web address]
